# Patient Record
Sex: Female | Born: 1985 | Race: White | Hispanic: No | Marital: Married | Smoking: Former smoker
Health system: Southern US, Community
[De-identification: ages and names within clinical notes are randomized; demographics above are authoritative.]

## PROBLEM LIST (undated history)

## (undated) DIAGNOSIS — R519 Headache, unspecified: Secondary | ICD-10-CM

## (undated) DIAGNOSIS — R87629 Unspecified abnormal cytological findings in specimens from vagina: Secondary | ICD-10-CM

## (undated) DIAGNOSIS — R51 Headache: Secondary | ICD-10-CM

## (undated) DIAGNOSIS — N946 Dysmenorrhea, unspecified: Secondary | ICD-10-CM

## (undated) HISTORY — DX: Headache, unspecified: R51.9

## (undated) HISTORY — DX: Dysmenorrhea, unspecified: N94.6

## (undated) HISTORY — PX: COLPOSCOPY: SHX161

## (undated) HISTORY — DX: Unspecified abnormal cytological findings in specimens from vagina: R87.629

## (undated) HISTORY — DX: Headache: R51

---
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
## 2007-09-12 ENCOUNTER — Other Ambulatory Visit: Admission: RE | Admit: 2007-09-12 | Discharge: 2007-09-12 | Payer: Self-pay | Admitting: Obstetrics and Gynecology

## 2008-09-18 ENCOUNTER — Other Ambulatory Visit: Admission: RE | Admit: 2008-09-18 | Discharge: 2008-09-18 | Payer: Self-pay | Admitting: Obstetrics and Gynecology

## 2009-07-29 ENCOUNTER — Encounter: Admission: RE | Admit: 2009-07-29 | Discharge: 2009-07-29 | Payer: Self-pay | Admitting: Family Medicine

## 2009-07-29 ENCOUNTER — Telehealth: Payer: Self-pay | Admitting: Gastroenterology

## 2010-08-14 ENCOUNTER — Encounter: Payer: Self-pay | Admitting: Family Medicine

## 2010-08-24 NOTE — Progress Notes (Signed)
Summary: triage  Phone Note Call from Patient Call back at [phone]   Caller: Mom Clydie Braun Call For: any doc Reason for Call: Talk to Nurse Summary of Call: Mother would like her daughter seen asap do to vomitting, bloating, unable to eat states that symptoms are getting worse x 2 weeks Initial call taken by: Tawni Levy,  July 29, 2009 8:38 AM  Follow-up for Phone Call        Left message for Clydie Braun to call back.   Ashok Cordia RN  July 29, 2009 2:36 PM  Talked with pt's mother, Clydie Braun.  Pt saw PCP yesterday and had ultrasound and lab work done. Given Rx for nausea and prilosec. Has not heard from these tests yet.  Mother will wait and get results of these test and see how meds help pt.  She will report back if no improvement and sch appt here either with Dr. Jarold Motto or PA if no MD appts are available.   Follow-up by: Ashok Cordia RN,  July 30, 2009 12:32 PM

## 2011-09-04 ENCOUNTER — Ambulatory Visit: Payer: No Typology Code available for payment source

## 2012-04-12 ENCOUNTER — Ambulatory Visit: Payer: Self-pay | Admitting: Psychology

## 2012-04-18 ENCOUNTER — Ambulatory Visit (INDEPENDENT_AMBULATORY_CARE_PROVIDER_SITE_OTHER): Payer: 59 | Admitting: Psychology

## 2012-04-18 DIAGNOSIS — F4321 Adjustment disorder with depressed mood: Secondary | ICD-10-CM

## 2013-04-08 ENCOUNTER — Telehealth: Payer: Self-pay | Admitting: Nurse Practitioner

## 2013-04-08 MED ORDER — DESOGESTREL-ETHINYL ESTRADIOL 0.15-0.02/0.01 MG (21/5) PO TABS: 1.0000 | ORAL_TABLET | Freq: Every day | ORAL | Status: DC

## 2013-04-08 NOTE — Telephone Encounter (Signed)
Patient needs to have aex. (Not scheduled yet) wants to know if she can go ahead an get a refill on bc.  [facility] [address]

## 2013-04-08 NOTE — Telephone Encounter (Signed)
Patient is needing refill Aex scheduled for 05/09/13 #1/1 refill sent to last patient until AEX pt is aware.

## 2013-04-30 ENCOUNTER — Ambulatory Visit (INDEPENDENT_AMBULATORY_CARE_PROVIDER_SITE_OTHER): Payer: No Typology Code available for payment source | Admitting: Physician Assistant

## 2013-04-30 VITALS — BP 112/72 | HR 96 | Temp 98.0°F | Resp 20 | Ht 63.5 in | Wt 198.6 lb

## 2013-04-30 DIAGNOSIS — J029 Acute pharyngitis, unspecified: Secondary | ICD-10-CM

## 2013-04-30 DIAGNOSIS — J020 Streptococcal pharyngitis: Secondary | ICD-10-CM

## 2013-04-30 LAB — POCT RAPID STREP A (OFFICE): Rapid Strep A Screen: POSITIVE — AB

## 2013-04-30 MED ORDER — AMOXICILLIN 875 MG PO TABS: 875.0000 mg | ORAL_TABLET | Freq: Two times a day (BID) | ORAL | Status: DC

## 2013-04-30 NOTE — Progress Notes (Signed)
Patient ID: Ruth Bailey MRN: [id number], DOB: 05-22-1986, 27 y.o. Date of Encounter: 04/30/2013, 8:56 PM  Primary Physician: No primary provider on file.  Chief Complaint:  Chief Complaint  Patient presents with  . Sore Throat    started this morning  . Cough    with drainage    HPI: 27 y.o. female presents with a 1-2 day history of sore throat, fever, chills, and cough. Subjective fever and chills. Sore throat is worse along the left side. No congestion, rhinorrhea, sinus pressure, otalgia, or headache. Normal hearing. No GI complaints. Able to swallow saliva, but hurts to do so. Decreased appetite secondary to sore throat. Works as a Manufacturing systems engineer. Multiple sick contacts. Will be traveling to the [employer] the next day.   History reviewed. No pertinent past medical history.   Home Meds: Prior to Admission medications   Medication Sig Start Date End Date Taking? Authorizing Provider  desogestrel-ethinyl estradiol ([name]) 0.15-0.02/0.01 MG (21/5) tablet Take 1 tablet by mouth daily. 04/08/13  Yes Lauro Franklin, FNP           Allergies:  Allergies  Allergen Reactions  . Latex Hives    History   Social History  . Marital Status: Single    Spouse Name: N/A    Number of Children: N/A  . Years of Education: N/A   Occupational History  . Not on file.   Social History Main Topics  . Smoking status: Current Every Day Smoker -- 0.25 packs/day  . Smokeless tobacco: Not on file  . Alcohol Use: No  . Drug Use: No  . Sexual Activity: Yes   Other Topics Concern  . Not on file   Social History Narrative  . No narrative on file     Review of Systems: Constitutional: positive for fever, chills, and fatigue HEENT: see above Cardiovascular: negative for chest pain or palpitations Respiratory: negative for wheezing, or shortness of breath Abdominal: negative for abdominal pain, nausea, vomiting or diarrhea Dermatological: negative for rash Neurologic:  positive for headache   Physical Exam: Blood pressure 112/72, pulse 96, temperature 98 F (36.7 C), temperature source Oral, resp. rate 20, height 5' 3.5" (1.613 m), weight 198 lb 9.6 oz (90.084 kg), last menstrual period 04/18/2013, SpO2 99.00%., Body mass index is 34.62 kg/(m^2). General: Well developed, well nourished, in no acute distress. Head: Normocephalic, atraumatic, eyes without discharge, sclera non-icteric, nares are patent. Bilateral auditory canals clear, TM's are without perforation, pearly grey with reflective cone of light bilaterally. No sinus TTP. Oral cavity moist, dentition normal. Posterior pharynx erythematous with exudate. No post nasal drip or peritonsillar abscess. Uvula midline.  Neck: Supple. No thyromegaly. Full ROM. Lymph nodes: less than 2 cm AC bilaterally. Lungs: Clear bilaterally to auscultation without wheezes, rales, or rhonchi. Breathing is unlabored. Heart: RRR with S1 S2. No murmurs, rubs, or gallops appreciated. Msk:  Strength and tone normal for age. Extremities: No clubbing or cyanosis. No edema. Neuro: Alert and oriented X 3. Moves all extremities spontaneously. CNII-XII grossly in tact. Psych:  Responds to questions appropriately with a normal affect.   Labs: Results for orders placed in visit on 04/30/13  POCT RAPID STREP A (OFFICE)      Result Value Range   Rapid Strep A Screen Positive (*) Negative     ASSESSMENT AND PLAN:  27 y.o. female with strep pharyngitis -Amoxicillin 875 mg 1 po bid #20 no RF -New toothbrush -Tylenol/Motrin prn -Rest/fluids -RTC precautions -RTC 3-5 days if  no improvement  Signed, Eula Listen, PA-C [facility] [place], [place] [address] [phone] 04/30/2013 8:56 PM

## 2013-05-09 ENCOUNTER — Encounter: Payer: Self-pay | Admitting: Nurse Practitioner

## 2013-05-09 ENCOUNTER — Ambulatory Visit (INDEPENDENT_AMBULATORY_CARE_PROVIDER_SITE_OTHER): Payer: No Typology Code available for payment source | Admitting: Nurse Practitioner

## 2013-05-09 VITALS — BP 110/60 | HR 72 | Ht 69.5 in | Wt 192.0 lb

## 2013-05-09 DIAGNOSIS — Z0000 Encounter for general adult medical examination without abnormal findings: Secondary | ICD-10-CM

## 2013-05-09 MED ORDER — DESOGESTREL-ETHINYL ESTRADIOL 0.15-0.02/0.01 MG (21/5) PO TABS: 1.0000 | ORAL_TABLET | Freq: Every day | ORAL | Status: DC

## 2013-05-09 MED ORDER — FLUCONAZOLE 150 MG PO TABS: 150.0000 mg | ORAL_TABLET | Freq: Once | ORAL | Status: DC

## 2013-05-09 NOTE — Patient Instructions (Signed)
General topics  Next pap or exam is  due in 1 year Take a Women's multivitamin Take 1200 mg. of calcium daily - prefer dietary If any concerns in interim to call back  Breast Self-Awareness Practicing breast self-awareness may pick up problems early, prevent significant medical complications, and possibly save your life. By practicing breast self-awareness, you can become familiar with how your breasts look and feel and if your breasts are changing. This allows you to notice changes early. It can also offer you some reassurance that your breast health is good. One way to learn what is normal for your breasts and whether your breasts are changing is to do a breast self-exam. If you find a lump or something that was not present in the past, it is best to contact your caregiver right away. Other findings that should be evaluated by your caregiver include nipple discharge, especially if it is bloody; skin changes or reddening; areas where the skin seems to be pulled in (retracted); or new lumps and bumps. Breast pain is seldom associated with cancer (malignancy), but should also be evaluated by a caregiver. BREAST SELF-EXAM The best time to examine your breasts is 5 7 days after your menstrual period is over.  ExitCare Patient Information 2013 [employer].   Exercise to Stay Healthy Exercise helps you become and stay healthy. EXERCISE IDEAS AND TIPS Choose exercises that:  You enjoy.  Fit into your day. You do not need to exercise really hard to be healthy. You can do exercises at a slow or medium level and stay healthy. You can:  Stretch before and after working out.  Try yoga, Pilates, or tai chi.  Lift weights.  Walk fast, swim, jog, run, climb stairs, bicycle, dance, or rollerskate.  Take aerobic classes. Exercises that burn about 150 calories:  Running 1  miles in 15 minutes.  Playing volleyball for 45 to 60 minutes.  Washing and waxing a car for 45 to 60  minutes.  Playing touch football for 45 minutes.  Walking 1  miles in 35 minutes.  Pushing a stroller 1  miles in 30 minutes.  Playing basketball for 30 minutes.  Raking leaves for 30 minutes.  Bicycling 5 miles in 30 minutes.  Walking 2 miles in 30 minutes.  Dancing for 30 minutes.  Shoveling snow for 15 minutes.  Swimming laps for 20 minutes.  Walking up stairs for 15 minutes.  Bicycling 4 miles in 15 minutes.  Gardening for 30 to 45 minutes.  Jumping rope for 15 minutes.  Washing windows or floors for 45 to 60 minutes. Document Released: 08/12/2010 Document Revised: 10/02/2011 Document Reviewed: 08/12/2010 ExitCare Patient Information 2013 [employer].   Other topics ( that may be useful information):    Sexually Transmitted Disease Sexually transmitted disease (STD) refers to any infection that is passed from person to person during sexual activity. This may happen by way of saliva, semen, blood, vaginal mucus, or urine. Common STDs include:  Gonorrhea.  Chlamydia.  Syphilis.  HIV/AIDS.  Genital herpes.  Hepatitis B and C.  Trichomonas.  Human papillomavirus (HPV).  Pubic lice. CAUSES  An STD may be spread by bacteria, virus, or parasite. A person can get an STD by:  Sexual intercourse with an infected person.  Sharing sex toys with an infected person.  Sharing needles with an infected person.  Having intimate contact with the genitals, mouth, or rectal areas of an infected person. SYMPTOMS  Some people may not have any symptoms, but   they can still pass the infection to others. Different STDs have different symptoms. Symptoms include:  Painful or bloody urination.  Pain in the pelvis, abdomen, vagina, anus, throat, or eyes.  Skin rash, itching, irritation, growths, or sores (lesions). These usually occur in the genital or anal area.  Abnormal vaginal discharge.  Penile discharge in men.  Soft, flesh-colored skin growths in the  genital or anal area.  Fever.  Pain or bleeding during sexual intercourse.  Swollen glands in the groin area.  Yellow skin and eyes (jaundice). This is seen with hepatitis. DIAGNOSIS  To make a diagnosis, your caregiver may:  Take a medical history.  Perform a physical exam.  Take a specimen (culture) to be examined.  Examine a sample of discharge under a microscope.  Perform blood test TREATMENT   Chlamydia, gonorrhea, trichomonas, and syphilis can be cured with antibiotic medicine.  Genital herpes, hepatitis, and HIV can be treated, but not cured, with prescribed medicines. The medicines will lessen the symptoms.  Genital warts from HPV can be treated with medicine or by freezing, burning (electrocautery), or surgery. Warts may come back.  HPV is a virus and cannot be cured with medicine or surgery.However, abnormal areas may be followed very closely by your caregiver and may be removed from the cervix, vagina, or vulva through office procedures or surgery. If your diagnosis is confirmed, your recent sexual partners need treatment. This is true even if they are symptom-free or have a negative culture or evaluation. They should not have sex until their caregiver says it is okay. HOME CARE INSTRUCTIONS  All sexual partners should be informed, tested, and treated for all STDs.  Take your antibiotics as directed. Finish them even if you start to feel better.  Only take over-the-counter or prescription medicines for pain, discomfort, or fever as directed by your caregiver.  Rest.  Eat a balanced diet and drink enough fluids to keep your urine clear or pale yellow.  Do not have sex until treatment is completed and you have followed up with your caregiver. STDs should be checked after treatment.  Keep all follow-up appointments, Pap tests, and blood tests as directed by your caregiver.  Only use latex condoms and water-soluble lubricants during sexual activity. Do not use  petroleum jelly or oils.  Avoid alcohol and illegal drugs.  Get vaccinated for HPV and hepatitis. If you have not received these vaccines in the past, talk to your caregiver about whether one or both might be right for you.  Avoid risky sex practices that can break the skin. The only way to avoid getting an STD is to avoid all sexual activity.Latex condoms and dental dams (for oral sex) will help lessen the risk of getting an STD, but will not completely eliminate the risk. SEEK MEDICAL CARE IF:   You have a fever.  You have any new or worsening symptoms. Document Released: 09/30/2002 Document Revised: 10/02/2011 Document Reviewed: 10/07/2010 ExitCare Patient Information 2013 ExitCare[employer] LLC.    Domestic Abuse You are being battered or abused if someone close to you hits, pushes, or physically hurts you in any way. You also are being abused if you are forced into activities. You are being sexually abused if you are forced to have sexual contact of any kind. You are being emotionally abused if you are made to feel worthless or if you are constantly threatened. It is important to remember that help is available. No one has the right to abuse you. PREVENTION OF FURTHER   ABUSE  Learn the warning signs of danger. This varies with situations but may include: the use of alcohol, threats, isolation from friends and family, or forced sexual contact. Leave if you feel that violence is going to occur.  If you are attacked or beaten, report it to the police so the abuse is documented. You do not have to press charges. The police can protect you while you or the attackers are leaving. Get the officer's name and badge number and a copy of the report.  Find someone you can trust and tell them what is happening to you: your caregiver, a nurse, clergy member, close friend or family member. Feeling ashamed is natural, but remember that you have done nothing wrong. No one deserves abuse. Document Released:  07/07/2000 Document Revised: 10/02/2011 Document Reviewed: 09/15/2010 ExitCare Patient Information 2013 [employer].    How Much is Too Much Alcohol? Drinking too much alcohol can cause injury, accidents, and health problems. These types of problems can include:   Car crashes.  Falls.  Family fighting (domestic violence).  Drowning.  Fights.  Injuries.  Burns.  Damage to certain organs.  Having a baby with birth defects. ONE DRINK CAN BE TOO MUCH WHEN YOU ARE:  Working.  Pregnant or breastfeeding.  Taking medicines. Ask your doctor.  Driving or planning to drive. If you or someone you know has a drinking problem, get help from a doctor.  Document Released: 05/06/2009 Document Revised: 10/02/2011 Document Reviewed: 05/06/2009 ExitCare Patient Information 2013 [employer].   Smoking Hazards Smoking cigarettes is extremely bad for your health. Tobacco smoke has over 200 known poisons in it. There are over 60 chemicals in tobacco smoke that cause cancer. Some of the chemicals found in cigarette smoke include:   Cyanide.  Benzene.  Formaldehyde.  Methanol (wood alcohol).  Acetylene (fuel used in welding torches).  Ammonia. Cigarette smoke also contains the poisonous gases nitrogen oxide and carbon monoxide.  Cigarette smokers have an increased risk of many serious medical problems and Smoking causes approximately:  90% of all lung cancer deaths in men.  80% of all lung cancer deaths in women.  90% of deaths from chronic obstructive lung disease. Compared with nonsmokers, smoking increases the risk of:  Coronary heart disease by 2 to 4 times.  Stroke by 2 to 4 times.  Men developing lung cancer by 23 times.  Women developing lung cancer by 13 times.  Dying from chronic obstructive lung diseases by 12 times.  . Smoking is the most preventable cause of death and disease in our society.  WHY IS SMOKING ADDICTIVE?  Nicotine is the chemical  agent in tobacco that is capable of causing addiction or dependence.  When you smoke and inhale, nicotine is absorbed rapidly into the bloodstream through your lungs. Nicotine absorbed through the lungs is capable of creating a powerful addiction. Both inhaled and non-inhaled nicotine may be addictive.  Addiction studies of cigarettes and spit tobacco show that addiction to nicotine occurs mainly during the teen years, when young people begin using tobacco products. WHAT ARE THE BENEFITS OF QUITTING?  There are many health benefits to quitting smoking.   Likelihood of developing cancer and heart disease decreases. Health improvements are seen almost immediately.  Blood pressure, pulse rate, and breathing patterns start returning to normal soon after quitting. QUITTING SMOKING   American Lung Association - 1-800-LUNGUSA  American Cancer Society - 1-800-ACS-2345 Document Released: 08/17/2004 Document Revised: 10/02/2011 Document Reviewed: 04/21/2009 ExitCare Patient Information 2013 ExitCare,   LLC.   Stress Management Stress is a state of physical or mental tension that often results from changes in your life or normal routine. Some common causes of stress are:  Death of a loved one.  Injuries or severe illnesses.  Getting fired or changing jobs.  Moving into a new home. Other causes may be:  Sexual problems.  Business or financial losses.  Taking on a large debt.  Regular conflict with someone at home or at work.  Constant tiredness from lack of sleep. It is not just bad things that are stressful. It may be stressful to:  Win the lottery.  Get married.  Buy a new car. The amount of stress that can be easily tolerated varies from person to person. Changes generally cause stress, regardless of the types of change. Too much stress can affect your health. It may lead to physical or emotional problems. Too little stress (boredom) may also become stressful. SUGGESTIONS TO  REDUCE STRESS:  Talk things over with your family and friends. It often is helpful to share your concerns and worries. If you feel your problem is serious, you may want to get help from a professional counselor.  Consider your problems one at a time instead of lumping them all together. Trying to take care of everything at once may seem impossible. List all the things you need to do and then start with the most important one. Set a goal to accomplish 2 or 3 things each day. If you expect to do too many in a single day you will naturally fail, causing you to feel even more stressed.  Do not use alcohol or drugs to relieve stress. Although you may feel better for a short time, they do not remove the problems that caused the stress. They can also be habit forming.  Exercise regularly - at least 3 times per week. Physical exercise can help to relieve that "uptight" feeling and will relax you.  The shortest distance between despair and hope is often a good night's sleep.  Go to bed and get up on time allowing yourself time for appointments without being rushed.  Take a short "time-out" period from any stressful situation that occurs during the day. Close your eyes and take some deep breaths. Starting with the muscles in your face, tense them, hold it for a few seconds, then relax. Repeat this with the muscles in your neck, shoulders, hand, stomach, back and legs.  Take good care of yourself. Eat a balanced diet and get plenty of rest.  Schedule time for having fun. Take a break from your daily routine to relax. HOME CARE INSTRUCTIONS   Call if you feel overwhelmed by your problems and feel you can no longer manage them on your own.  Return immediately if you feel like hurting yourself or someone else. Document Released: 01/03/2001 Document Revised: 10/02/2011 Document Reviewed: 08/26/2007 ExitCare Patient Information 2013 ExitCare, [employer].   

## 2013-05-09 NOTE — Progress Notes (Signed)
Patient ID: Ruth Bailey, female   DOB: [birth year], 27 y.o.   MRN: [id number] 27 y.o. G0 Single Caucasian Fe here for annual exam. Menses is usually 4-6 days on [name]. Has been off [name] since end of September even though refill was called in, pt did not get notification.  Using condoms each time for birth control.  LMP 04/18/13.  Will restart [name] after next menses.  In late November will call us back to get [facility] IUD.  Insurance changes take effect end of that month.  Same partner X 2.5 years.  Now engaged and to be married 04/2014. Since on Augmentin now getting a yeast infection.  Patient's last menstrual period was 04/18/2013.          Sexually active: yes  The current method of family planning is [name] (estrogen/progesterone).    Exercising: yes  Gym/ health club routine includes [employer], kickboxing 3-4 times per week. Smoker:  Former, quit 6 months ago  Health Maintenance: Pap:  04/09/12, ASCUS, neg HR HPV TDaP:  03/09/11 Gardasil completed 7/07 Labs: declined    reports that she quit smoking about 6 months ago. She has never used smokeless tobacco. She reports that she drinks about 1.5 ounces of alcohol per week. She reports that she does not use illicit drugs.  History reviewed. No pertinent past medical history.  History reviewed. No pertinent past surgical history.  Current Outpatient Prescriptions  Medication Sig Dispense Refill  . amoxicillin (AMOXIL) 875 MG tablet Take 875 mg by mouth 2 (two) times daily.      Marland Kitchen desogestrel-ethinyl estradiol ([name]) 0.15-0.02/0.01 MG (21/5) tablet Take 1 tablet by mouth daily.  1 Package  1   No current facility-administered medications for this visit.    Family History  Problem Relation Age of Onset  . Cancer Maternal Grandfather     lung cancer  . Cancer Paternal Grandmother 43    bone cancer    ROS:  Pertinent items are noted in HPI.  Otherwise, a comprehensive ROS was negative.  Exam:   BP 110/60  Pulse 72  Ht 5' 9.5" (1.765 m)   Wt 192 lb (87.091 kg)  BMI 27.96 kg/m2  LMP 04/18/2013 Height: 5' 9.5" (176.5 cm)  Ht Readings from Last 3 Encounters:  05/09/13 5' 9.5" (1.765 m)  04/30/13 5' 3.5" (1.613 m)    General appearance: alert, cooperative and appears stated age Head: Normocephalic, without obvious abnormality, atraumatic Neck: no adenopathy, supple, symmetrical, trachea midline and thyroid normal to inspection and palpation Lungs: clear to auscultation bilaterally Breasts: normal appearance, no masses or tenderness Heart: regular rate and rhythm Abdomen: soft, non-tender; no masses,  no organomegaly Extremities: extremities normal, atraumatic, no cyanosis or edema Skin: Skin color, texture, turgor normal. No rashes or lesions Lymph nodes: Cervical, supraclavicular, and axillary nodes normal. No abnormal inguinal nodes palpated Neurologic: Grossly normal   Pelvic: External genitalia:  no lesions              Urethra:  normal appearing urethra with no masses, tenderness or lesions              Bartholin's and Skene's: normal                 Vagina: normal appearing vagina with normal color and Bracy thick discharge consistent with yeast discharge, no lesions              Cervix: anteverted  Pap taken: yes Bimanual Exam:  Uterus:  normal size, contour, position, consistency, mobility, non-tender              Adnexa: no mass, fullness, tenderness               Rectovaginal: Confirms               Anus:  normal sphincter tone, no lesions  A:  Well Woman with normal exam  Contraception  Yeast vaginitis from antibiotics  History of ASCUS pap with negative HR HPV lst year  P:   Pap smear as per guidelines Done today  Refill [name] 3 months / 3  Diflucan 150 mg X 2 doses  Counseled on breast self exam, adequate intake of calcium and vitamin D, diet and exercise return annually or prn  An After Visit Summary was printed and given to the patient.

## 2013-05-10 NOTE — Progress Notes (Signed)
Encounter reviewed by Dr. Elaine Roanhorse Silva.  

## 2013-05-13 LAB — IPS PAP TEST WITH REFLEX TO HPV

## 2013-07-19 ENCOUNTER — Ambulatory Visit: Payer: No Typology Code available for payment source

## 2013-07-21 ENCOUNTER — Ambulatory Visit (INDEPENDENT_AMBULATORY_CARE_PROVIDER_SITE_OTHER): Payer: No Typology Code available for payment source | Admitting: Nurse Practitioner

## 2013-07-21 ENCOUNTER — Encounter: Payer: Self-pay | Admitting: Nurse Practitioner

## 2013-07-21 VITALS — BP 122/80 | HR 64 | Ht 69.5 in | Wt 192.0 lb

## 2013-07-21 DIAGNOSIS — R109 Unspecified abdominal pain: Secondary | ICD-10-CM

## 2013-07-21 LAB — POCT URINALYSIS DIPSTICK
Bilirubin, UA: NEGATIVE
Glucose, UA: NEGATIVE
Protein, UA: NEGATIVE
Urobilinogen, UA: NEGATIVE
pH, UA: 6

## 2013-07-21 NOTE — Progress Notes (Signed)
Subjective:     Patient ID: Ruth Bailey, female   DOB: 04/11/1986, 27 y.o.   MRN: [id number]  HPI  This 27 yo SW  female complaints of RLQ pain for 6 days. States pain was a sudden onset with location in RLQ.  Pain is described as initially sharp and stabbing but intermittent.  Now has radiation to right lower back.  Currently pain is intermittent and more of a pressure with occasional stabbing with a pain scale of 8-9/10.  No bloating, some nausea, no fever/ or chills. No UA symptoms. LMP 12/16 with pain onset 12/22 or 12/23. Vaginal spotting that is pink brownish started 3 days ago. Still on [name] and using condoms for birth control.  No missed or skipped pills. Currently pain scale 5-6/10. No change in partners. They are engaged and planning their wedding.   Review of Systems  HENT: Negative.   Respiratory: Negative.   Cardiovascular: Negative.   Gastrointestinal: Negative.  Negative for nausea, vomiting, abdominal pain, diarrhea, constipation, blood in stool, abdominal distention, anal bleeding and rectal pain.  Endocrine: Negative.   Genitourinary: Positive for menstrual problem and pelvic pain. Negative for dysuria, urgency, frequency, hematuria, flank pain, decreased urine volume, vaginal bleeding, vaginal discharge, genital sores, vaginal pain and dyspareunia.       Spotting started 3 days ago and in on CD # 10 of pill pack.  Musculoskeletal: Positive for back pain. Negative for joint swelling, myalgias, neck pain and neck stiffness.       Right lower back pain that radiates from the abdomen  Skin: Negative.   Neurological: Negative.   Psychiatric/Behavioral: Negative.        Objective:   Physical Exam  Constitutional: She is oriented to person, place, and time. She appears well-developed and well-nourished. No distress.  Pulmonary/Chest: Effort normal.  Abdominal: Soft. She exhibits no distension and no mass. There is tenderness. There is no rebound and no guarding.  No flank  pain, most of pain to palpation is RLQ without rebounding.  Genitourinary:  Very light brown vaginal spotting. No cervicitis. No tenderness on the left with bimanual exam. A lot of pain on the right side with ? OV cyst palpable - but she is guarding.  No pain on rectal exam. No mass. Normal BS.  Neurological: She is alert and oriented to person, place, and time.  Psychiatric: She has a normal mood and affect. Her behavior is normal. Judgment and thought content normal.       Assessment:     R/O right OV mass / cyst RLQ pain On [name]    Plan:    Will get PUS at [facility] and follow with results In the interim she will continue with OTC NSAID'S and heating pad. If symptoms worsens to call back ASAP.

## 2013-07-22 ENCOUNTER — Telehealth: Payer: Self-pay | Admitting: Nurse Practitioner

## 2013-07-22 ENCOUNTER — Ambulatory Visit (HOSPITAL_COMMUNITY): Payer: Self-pay

## 2013-07-22 ENCOUNTER — Ambulatory Visit (HOSPITAL_COMMUNITY)
Admission: RE | Admit: 2013-07-22 | Discharge: 2013-07-22 | Disposition: A | Discharge status: Home / Self Care | Payer: No Typology Code available for payment source | Source: Ambulatory Visit | Attending: Nurse Practitioner | Admitting: Nurse Practitioner

## 2013-07-22 DIAGNOSIS — N854 Malposition of uterus: Secondary | ICD-10-CM | POA: Insufficient documentation

## 2013-07-22 DIAGNOSIS — N946 Dysmenorrhea, unspecified: Secondary | ICD-10-CM | POA: Insufficient documentation

## 2013-07-22 DIAGNOSIS — R109 Unspecified abdominal pain: Secondary | ICD-10-CM

## 2013-07-22 DIAGNOSIS — R1031 Right lower quadrant pain: Secondary | ICD-10-CM | POA: Insufficient documentation

## 2013-07-22 NOTE — Progress Notes (Signed)
Encounter reviewed by Dr. Brook Silva.  

## 2013-07-22 NOTE — Telephone Encounter (Signed)
Returning call.

## 2013-07-22 NOTE — Telephone Encounter (Signed)
Left message to call for results of PUS done at [facility].  Asked to [facility] and ask for [facility].

## 2013-07-22 NOTE — Telephone Encounter (Signed)
patient is informed of PUS results and no evidence of an OV cyst.  She is better and has less pain. Her history and symptoms certainly were suggestive of an OV cyst but maybe already resolved.  She is informed that if she has continued pain or worsens to CB.  May need other studies such as CT scan.

## 2013-09-10 ENCOUNTER — Ambulatory Visit (INDEPENDENT_AMBULATORY_CARE_PROVIDER_SITE_OTHER): Payer: No Typology Code available for payment source | Admitting: Physician Assistant

## 2013-09-10 VITALS — BP 112/64 | HR 111 | Temp 98.6°F | Resp 18 | Wt 186.0 lb

## 2013-09-10 DIAGNOSIS — J101 Influenza due to other identified influenza virus with other respiratory manifestations: Secondary | ICD-10-CM

## 2013-09-10 DIAGNOSIS — J111 Influenza due to unidentified influenza virus with other respiratory manifestations: Secondary | ICD-10-CM

## 2013-09-10 DIAGNOSIS — R6889 Other general symptoms and signs: Secondary | ICD-10-CM

## 2013-09-10 LAB — POCT INFLUENZA A/B
INFLUENZA B, POC: NEGATIVE
Influenza A, POC: POSITIVE

## 2013-09-10 MED ORDER — GUAIFENESIN ER 1200 MG PO TB12: 1.0000 | ORAL_TABLET | Freq: Two times a day (BID) | ORAL | Status: AC

## 2013-09-10 MED ORDER — OSELTAMIVIR PHOSPHATE 75 MG PO CAPS: 75.0000 mg | ORAL_CAPSULE | Freq: Two times a day (BID) | ORAL | Status: AC

## 2013-09-10 MED ORDER — HYDROCOD POLST-CHLORPHEN POLST 10-8 MG/5ML PO LQCR: 5.0000 mL | Freq: Two times a day (BID) | ORAL | Status: AC

## 2013-09-10 NOTE — Patient Instructions (Signed)
Please push fluids.  Tylenol and Motrin for fever and body aches.    

## 2013-09-10 NOTE — Progress Notes (Signed)
   Subjective:    Patient ID: Ruth Bailey, female    DOB: September 24, 1985, 28 y.o.   MRN: [id number]  HPI Pt presents to clinic with facial pain and myalgias and chills for about 6 hours. She went to bed and felt fine and then this woke her up this am.  She feels terrible. 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  She tried some sudafed this am and that seemed to help her congestion a little.  OTC meds - sudafed Sick contacts - teacher Flu vaccine - no  Review of Systems  Constitutional: Positive for chills. Negative for fever.  HENT: Positive for congestion, postnasal drip and rhinorrhea (clear). Negative for sore throat.   Respiratory: Positive for cough (dry).   Gastrointestinal: Negative for nausea, vomiting and diarrhea.  Musculoskeletal: Positive for myalgias.  Neurological: Positive for headaches.       Objective:   Physical Exam  Vitals reviewed. Constitutional: She appears well-developed and well-nourished.  HENT:  Head: Normocephalic and atraumatic.  Right Ear: Hearing, tympanic membrane, external ear and ear canal normal.  Left Ear: Hearing, tympanic membrane, external ear and ear canal normal.  Nose: Mucosal edema (red) present. Rhinorrhea: clear.  Mouth/Throat: Uvula is midline, oropharynx is clear and moist and mucous membranes are normal.  Eyes: Conjunctivae are normal.  Cardiovascular: Normal rate, regular rhythm and normal heart sounds.   No murmur heard. Pulmonary/Chest: Effort normal and breath sounds normal. She has no wheezes.  Lymphadenopathy:       Head (right side): No tonsillar, no preauricular and no occipital adenopathy present.       Head (left side): No tonsillar, no preauricular and no occipital adenopathy present.    She has no cervical adenopathy.       Right: No supraclavicular adenopathy present.       Left: No supraclavicular adenopathy present.  Skin: Skin is warm and dry.  Psychiatric: She has a normal mood and affect.  Her behavior is normal. Judgment and thought content normal.     Results for orders placed in visit on 09/10/13  POCT INFLUENZA A/B      Result Value Ref Range   Influenza A, POC Positive     Influenza B, POC Negative          Assessment & Plan:  Flu-like symptoms - Plan: POCT Influenza A/B  Influenza A - Plan: oseltamivir (TAMIFLU) 75 MG capsule, Guaifenesin (MUCINEX MAXIMUM STRENGTH) 1200 MG TB12, chlorpheniramine-HYDROcodone (TUSSIONEX PENNKINETIC ER) 10-8 MG/5ML LQCR  Symptomatic care d/w pt.  Benny LennertSarah Weber PA-C 09/10/2013 11:18 AM

## 2014-02-17 ENCOUNTER — Encounter: Payer: Self-pay | Admitting: Nurse Practitioner

## 2014-02-17 ENCOUNTER — Ambulatory Visit (INDEPENDENT_AMBULATORY_CARE_PROVIDER_SITE_OTHER): Payer: No Typology Code available for payment source | Admitting: Nurse Practitioner

## 2014-02-17 VITALS — BP 130/80 | HR 72 | Ht 69.5 in | Wt 188.0 lb

## 2014-02-17 DIAGNOSIS — Z975 Presence of (intrauterine) contraceptive device: Secondary | ICD-10-CM

## 2014-02-17 NOTE — Progress Notes (Signed)
Patient ID: Ruth Bailey, female   DOB: 17-Apr-1986, 28 y.o.   MRN: [id number]  S: This 28 yo G0 WS Fe here for a consult about [name].  In the past she has been on the [employer] Ring, Ortho Evra Patch and now [name] with [name].  Her  menses now regular and lasting 4-5 days. Moderate to light.  Some cramps that are better with OTC NSAID's.  No PMS or weight gain.  She is getting married on October 9 th and would like to get Skylab IUD for contraception.  He is 32 and they want to wait about 3 years before planning a pregnancy.   She does have a history of right lower quadrant pain and was evaluated with PUS 07/21/13 but did not have an ovarian cyst.  Plan: Discussed Skyla vs Mirena IUD - she feels more comfortable having a menses  Information is given about [name]  Order is placed   She is informed that she will need consult visit with Dr. Edward JollySilva prior to insertion and rationale - she is in agreement with plan.  Consult visit: 15 minutes face to face

## 2014-02-18 ENCOUNTER — Telehealth: Payer: Self-pay | Admitting: Nurse Practitioner

## 2014-02-18 NOTE — Telephone Encounter (Signed)
I left a message for patient to call and schedule IUD consult with Dr.Silva. (msg left on my desk from Shirlyn GoltzPatty Grubb to call patient and schedule IUD consult with Dr.Silva.)

## 2014-02-19 NOTE — Progress Notes (Signed)
Encounter reviewed by Dr. Brook Silva.  

## 2014-02-25 ENCOUNTER — Encounter: Payer: Self-pay | Admitting: Obstetrics and Gynecology

## 2014-02-25 ENCOUNTER — Ambulatory Visit (INDEPENDENT_AMBULATORY_CARE_PROVIDER_SITE_OTHER): Payer: No Typology Code available for payment source | Admitting: Obstetrics and Gynecology

## 2014-02-25 VITALS — BP 118/64 | HR 96 | Resp 14 | Ht 69.5 in | Wt 189.8 lb

## 2014-02-25 DIAGNOSIS — Z3009 Encounter for other general counseling and advice on contraception: Secondary | ICD-10-CM

## 2014-02-25 MED ORDER — MISOPROSTOL 200 MCG PO TABS: ORAL_TABLET | ORAL | Status: DC

## 2014-02-25 NOTE — Progress Notes (Signed)
Patient ID: Ruth BerkshireJessica A Pals, female   DOB: 11-21-1985, 28 y.o.   MRN: [id number] GYNECOLOGY  VISIT   HPI: 28 y.o.   Single  Caucasian  female   G0P0 with Patient's last menstrual period was 02/12/2014.   here for  Consultation regarding [name].  Interested in long term contraception.   Engaged to be married.  [place] 2 months.   Menses are very regular.  No significant pain.   Used OCPs, NuvaRing, Ortho Evra.   GYNECOLOGIC HISTORY: Patient's last menstrual period was 02/12/2014. Contraception:  [name]'s--Azerette  Menopausal hormone therapy: n/a        OB History   Grav Para Term Preterm Abortions TAB SAB Ect Mult Living   0                  There are no active problems to display for this patient.   Past Medical History  Diagnosis Date  . Dysmenorrhea     better on [name]    History reviewed. No pertinent past surgical history.  Current Outpatient Prescriptions  Medication Sig Dispense Refill  . desogestrel-ethinyl estradiol ([name]) 0.15-0.02/0.01 MG (21/5) tablet Take 1 tablet by mouth daily.  3 Package  3   No current facility-administered medications for this visit.     ALLERGIES: Latex  Family History  Problem Relation Age of Onset  . Cancer Maternal Grandfather     lung cancer  . Cancer Paternal Grandmother 272    bone cancer    History   Social History  . Marital Status: Single    Spouse Name: N/A    Number of Children: 0  . Years of Education: N/A   Occupational History  . Not on file.   Social History Main Topics  . Smoking status: Former Smoker -- 0.25 packs/day for 4 years    Types: Cigarettes    Quit date: 11/07/2012  . Smokeless tobacco: Never Used  . Alcohol Use: 1.5 oz/week    3 drink(s) per week  . Drug Use: No  . Sexual Activity: Yes    Partners: Male    Birth Control/ Protection: Pill     Comment: Azerette   Other Topics Concern  . Not on file   Social History Narrative  . No narrative on file     ROS:  Pertinent items are noted in HPI.  PHYSICAL EXAMINATION:    BP 118/64  Pulse 96  Resp 14  Ht 5' 9.5" (1.765 m)  Wt 189 lb 12.8 oz (86.093 kg)  BMI 27.64 kg/m2  LMP 02/12/2014     General appearance: alert, cooperative and appears stated age.  Pelvic: External genitalia:  no lesions              Urethra:  normal appearing urethra with no masses, tenderness or lesions              Bartholins and Skenes: normal                 Vagina: normal appearing vagina with normal color and discharge, no lesions              Cervix: normal appearance                   Bimanual Exam:  Uterus:  uterus is normal size, shape, consistency and nontender  Adnexa: normal adnexa in size, nontender and no masses  ASSESSMENT  History of dysmenorrhea.  Desire for long term contraception.  PLAN  Counseled on [name] risks and benefits. Return during first 5 days of cycle (patient is on OCPs and will come in during the first 5 days of her last week of pills in the pack. )   An After Visit Summary was printed and given to the patient.  _15_____ minutes face to face time of which over 50% was spent in counseling.

## 2014-03-03 ENCOUNTER — Telehealth: Payer: Self-pay | Admitting: Nurse Practitioner

## 2014-03-03 NOTE — Telephone Encounter (Signed)
Spoke with patient. Advised that per benefits quote received, IUD and insertion is covered at 100%. There will be 0 patient liability. Patient is to call within the first 5 days of her cycle to schedule insertion. °

## 2014-03-09 ENCOUNTER — Telehealth: Payer: Self-pay | Admitting: Obstetrics and Gynecology

## 2014-03-09 NOTE — Telephone Encounter (Signed)
Spoke with patient. Patient states that she started her cycle today and would like to schedule IUD insertion at this time. Patient requesting late afternoon appointment at 3:45pm. Advised patient that latest appointment I have this week with Dr.Silva is 2pm on 8/20. 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 Advised patient that our only other option is to schedule with next cycle. Patient states that she really wanted the IUD in for one month before getting married. Patient will see if she can rearrange schedule and give us a call back to schedule if she can. "I may just not be able to get the IUD which is unfortunate."   Routing to provider for final review. Patient agreeable to disposition. Will close encounter

## 2014-03-09 NOTE — Telephone Encounter (Signed)
Patient calling to let us know she started her cycle wants to get IUD insertion scheduled

## 2014-03-10 ENCOUNTER — Telehealth: Payer: Self-pay | Admitting: Obstetrics and Gynecology

## 2014-03-10 NOTE — Telephone Encounter (Signed)
Patient wants to schedule IUD insertion appt that was offered is not available anymore.  Jannet AskewKaitlyn E Hines, RN at 03/09/2014 4:27 PM    Status: Signed       Spoke with patient. Patient states that she started her cycle today and would like to schedule IUD insertion at this time. Patient requesting late afternoon appointment at 3:45pm. Advised patient that latest appointment I have this week with Dr.Silva is 2pm on 8/20. 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 Advised patient that our only other option is to schedule with next cycle. Patient states that she really wanted the IUD in for one month before getting married. Patient will see if she can rearrange schedule and give us a call back to schedule if she can. "I may just not be able to get the IUD which is unfortunate."  Routing to provider for final review. Patient agreeable to disposition. Will close encounter        Tiffany A Decker at 03/09/2014 4:20 PM     Status: Signed        Patient calling to let us know she started her cycle wants to get IUD insertion scheduled

## 2014-03-10 NOTE — Telephone Encounter (Signed)
Spoke with patient. Advised spoke with Nurse manager Kennon RoundsSally. Appointment scheduled for Thursday 8/20 at 4pm with Dr.Silva (time per Kennon RoundsSally). Advised this is a work in spot and an exception. Advised patient to arrive 15 minutes early for appointment. Cytotec instructions given. Take one tablet the night before procedure and one tablet the morning of procedure. Pre procedure instructions given.  Motrin instructions given. Motrin=Advil=Ibuprofen, 800 mg one hour before appointment. Eat a meal and hydrate well before appointment. Patient agreeable and verbalizes understanding.  Routing to provider for final review. Patient agreeable to disposition. Will close encounter

## 2014-03-12 ENCOUNTER — Encounter: Payer: Self-pay | Admitting: Obstetrics and Gynecology

## 2014-03-12 ENCOUNTER — Ambulatory Visit (INDEPENDENT_AMBULATORY_CARE_PROVIDER_SITE_OTHER): Payer: No Typology Code available for payment source | Admitting: Obstetrics and Gynecology

## 2014-03-12 VITALS — BP 122/66 | HR 84 | Ht 69.5 in | Wt 192.0 lb

## 2014-03-12 DIAGNOSIS — Z3009 Encounter for other general counseling and advice on contraception: Secondary | ICD-10-CM

## 2014-03-12 DIAGNOSIS — Z30430 Encounter for insertion of intrauterine contraceptive device: Secondary | ICD-10-CM

## 2014-03-12 NOTE — Progress Notes (Signed)
Patient ID: Ruth Bailey, female   DOB: [birth year], 28 y.o.   MRN: [id number] GYNECOLOGY  VISIT   HPI: 28 y.o.   Single  Caucasian  female   G0P0 with Patient's last menstrual period was 03/09/2014.  Normal menses.  here for  [facility] IUD insertion.  Took Cytotec and Motrin.   GYNECOLOGIC HISTORY: Patient's last menstrual period was 03/09/2014. Contraception: [name]'s   Menopausal hormone therapy: n/a        OB History   Grav Para Term Preterm Abortions TAB SAB Ect Mult Living   0                  There are no active problems to display for this patient.   Past Medical History  Diagnosis Date  . Dysmenorrhea     better on [name]    No past surgical history on file.  Current Outpatient Prescriptions  Medication Sig Dispense Refill  . desogestrel-ethinyl estradiol ([name]) 0.15-0.02/0.01 MG (21/5) tablet Take 1 tablet by mouth daily.  3 Package  3  . misoprostol (CYTOTEC) 200 MCG tablet Take one tablet (200 mcg) the evening prior to your procedure and then one tablet the morning of your procedure  2 tablet  0   No current facility-administered medications for this visit.     ALLERGIES: Latex  Family History  Problem Relation Age of Onset  . Cancer Maternal Grandfather     lung cancer  . Cancer Paternal Grandmother 23    bone cancer    History   Social History  . Marital Status: Single    Spouse Name: N/A    Number of Children: 0  . Years of Education: N/A   Occupational History  . Not on file.   Social History Main Topics  . Smoking status: Former Smoker -- 0.25 packs/day for 4 years    Types: Cigarettes    Quit date: 11/07/2012  . Smokeless tobacco: Never Used  . Alcohol Use: 1.5 oz/week    3 drink(s) per week  . Drug Use: No  . Sexual Activity: Yes    Partners: Male    Birth Control/ Protection: Pill     Comment: Azerette   Other Topics Concern  . Not on file   Social History Narrative  . No narrative on file    ROS:  Pertinent items are noted  in HPI.  PHYSICAL EXAMINATION:    BP 122/66  Pulse 84  Ht 5' 9.5" (1.765 m)  Wt 192 lb (87.091 kg)  BMI 27.96 kg/m2  LMP 03/09/2014     General appearance: alert, cooperative and appears stated age   Pelvic: External genitalia:  no lesions              Urethra:  normal appearing urethra with no masses, tenderness or lesions              Bartholins and Skenes: normal                 Vagina: normal appearing vagina with normal color and discharge, no lesions              Cervix: normal appearance                   Bimanual Exam:  Uterus:  uterus is normal size, shape, consistency and nontender  Adnexa: normal adnexa in size, nontender and no masses                                        [name] insertion - lot number [id number], Expiration 08/2015  Consent for procedure.  Speculum placed in vagina.  Sterile prep of cervix with Hibiclens.  Tenaculum to anterior cervical lip Paracervical block with 10 cc 1% lidocaine - lot number [id number], Expiration 12/23/14. Uterus sounded to 7 cm.  [name] placed without difficulty.  Strings trimmed. No complications.  Minimal EBL.  Given Coke after procedure.   ASSESSMENT  Slyla IUD insertion.   PLAN  Precautions given.  Follow up in 5 weeks for a recheck.    An After Visit Summary was printed and given to the patient.

## 2014-03-12 NOTE — Patient Instructions (Signed)

## 2014-04-16 ENCOUNTER — Ambulatory Visit (INDEPENDENT_AMBULATORY_CARE_PROVIDER_SITE_OTHER): Payer: No Typology Code available for payment source | Admitting: Obstetrics and Gynecology

## 2014-04-16 ENCOUNTER — Encounter: Payer: Self-pay | Admitting: Obstetrics and Gynecology

## 2014-04-16 VITALS — BP 116/64 | HR 70 | Ht 69.5 in | Wt 200.0 lb

## 2014-04-16 DIAGNOSIS — Z30431 Encounter for routine checking of intrauterine contraceptive device: Secondary | ICD-10-CM

## 2014-04-16 NOTE — Patient Instructions (Signed)
Congratulations on your marriage!

## 2014-04-16 NOTE — Progress Notes (Signed)
Patient ID: Ruth Bailey, female   DOB: 11/18/1985, 28 y.o.   MRN: [id number] GYNECOLOGY  VISIT   HPI: 28 y.o.   Single  Caucasian  female   G0P0 with No LMP recorded. Patient is not currently having periods (Reason: IUD).   Having spotting.  Cramping the day of the insertion.  Sexually active since the insertion.  No problems.   Getting married in one week.   GYNECOLOGIC HISTORY: No LMP recorded. Patient is not currently having periods (Reason: IUD). Contraception:  [name]--inserted 03-12-14    Menopausal hormone therapy: n/a        OB History   Grav Para Term Preterm Abortions TAB SAB Ect Mult Living   0                  There are no active problems to display for this patient.   Past Medical History  Diagnosis Date  . Dysmenorrhea     better on [name]    History reviewed. No pertinent past surgical history.  Current Outpatient Prescriptions  Medication Sig Dispense Refill  . Levonorgestrel (SKYLA) 13.5 MG IUD by Intrauterine route.       No current facility-administered medications for this visit.     ALLERGIES: Latex  Family History  Problem Relation Age of Onset  . Cancer Maternal Grandfather     lung cancer  . Cancer Paternal Grandmother 41    bone cancer    History   Social History  . Marital Status: Single    Spouse Name: N/A    Number of Children: 0  . Years of Education: N/A   Occupational History  . Not on file.   Social History Main Topics  . Smoking status: Former Smoker -- 0.25 packs/day for 4 years    Types: Cigarettes    Quit date: 11/07/2012  . Smokeless tobacco: Never Used  . Alcohol Use: 1.5 oz/week    3 drink(s) per week  . Drug Use: No  . Sexual Activity: Yes    Partners: Male    Birth Control/ Protection: IUD     Comment: [name] inserted 03-12-14   Other Topics Concern  . Not on file   Social History Narrative  . No narrative on file    ROS:  Pertinent items are noted in HPI.  PHYSICAL EXAMINATION:    BP  116/64  Pulse 70  Ht 5' 9.5" (1.765 m)  Wt 200 lb (90.719 kg)  BMI 29.12 kg/m2     General appearance: alert, cooperative and appears stated age Lungs: clear to auscultation bilaterally Heart: regular rate and rhythm Abdomen: soft, non-tender; no masses,  no organomegaly No abnormal inguinal nodes palpated  Pelvic: External genitalia:  no lesions              Urethra:  normal appearing urethra with no masses, tenderness or lesions              Bartholins and Skenes: normal                 Vagina: normal appearing vagina with normal color and discharge, no lesions              Cervix: normal appearance.  IUD strings noted.                    Bimanual Exam:  Uterus:  uterus is normal size, shape, consistency and nontender  Adnexa: normal adnexa in size, nontender and no masses                                       ASSESSMENT  [name] patient.  Doing well.  PLAN  Return for annual exam and prn.     An After Visit Summary was printed and given to the patient.  ___15___ minutes face to face time of which over 50% was spent in counseling.

## 2015-01-12 IMAGING — US US TRANSVAGINAL NON-OB
1 series · 14 of 25 positions shown · non-contrast
Comparison: None

CLINICAL DATA: Right lower quadrant pain.  Dysmenorrhea.

EXAM:
TRANSABDOMINAL AND TRANSVAGINAL ULTRASOUND OF PELVIS
TECHNIQUE: Both transabdominal and transvaginal ultrasound examinations of the
pelvis were performed. Transabdominal technique was performed for
global imaging of the pelvis including uterus, ovaries, adnexal
regions, and pelvic cul-de-sac. It was necessary to proceed with
endovaginal exam following the transabdominal exam to visualize the
ovaries and adnexae.

[Series 1: us pelvis complete · 14 of 69 slices shown]
[im 1/69]
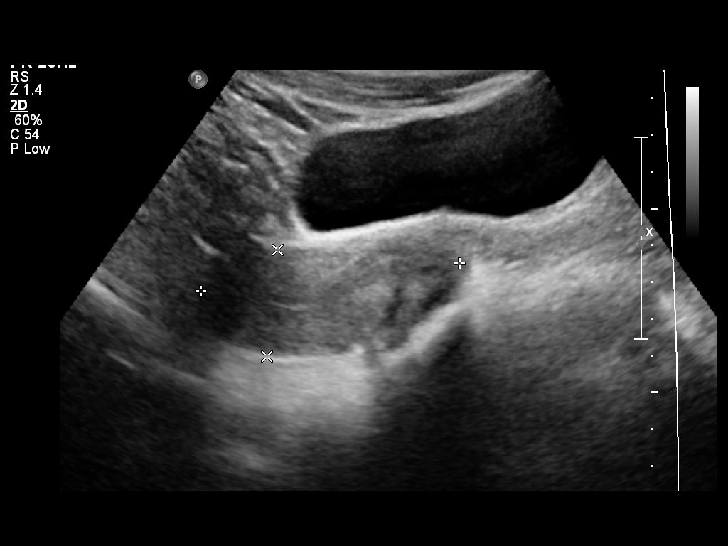
[im 6/69]
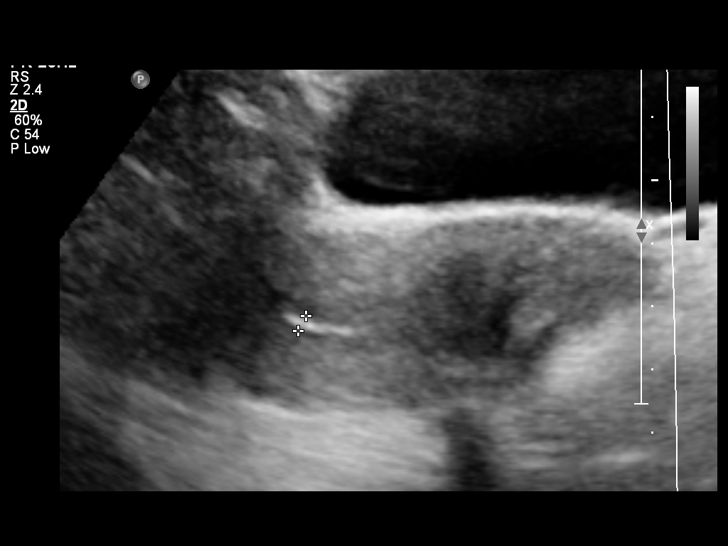
[im 12/69]
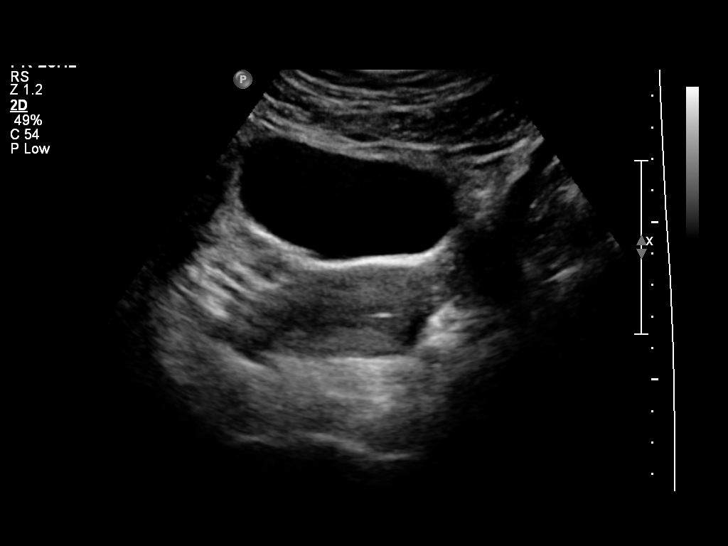
[im 18/69]
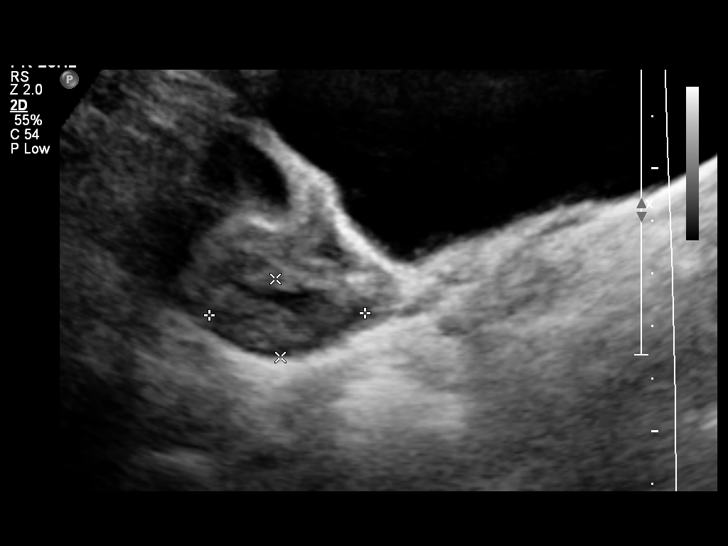
[im 23/69]
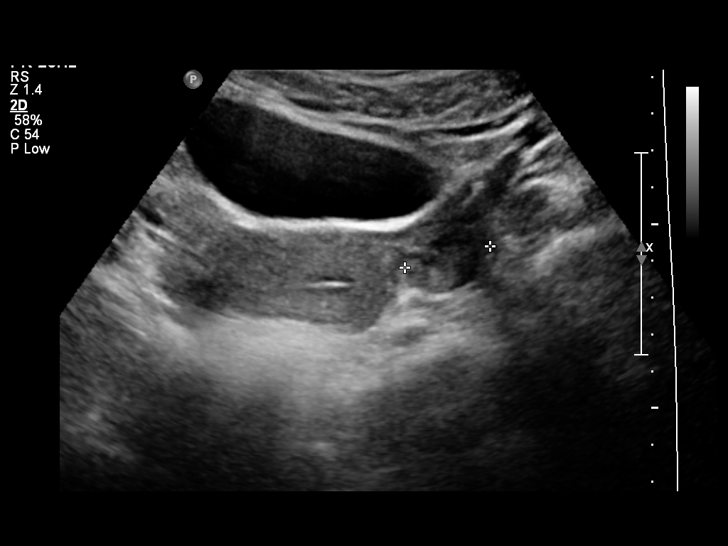
[im 26/69]
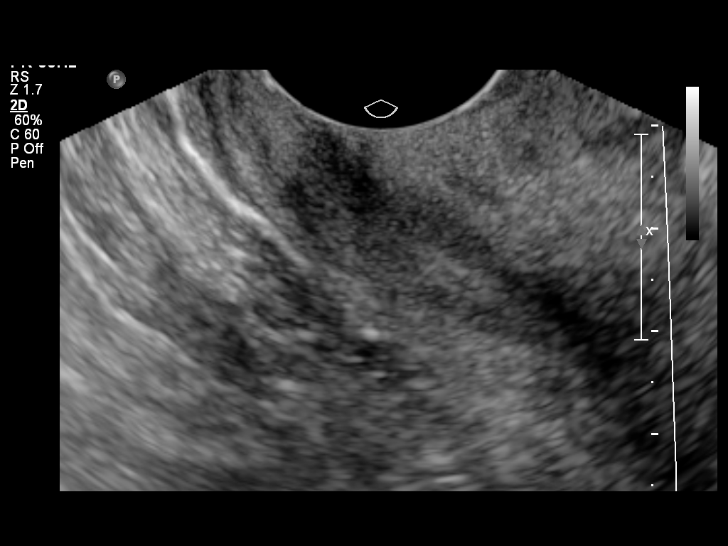
[im 32/69]
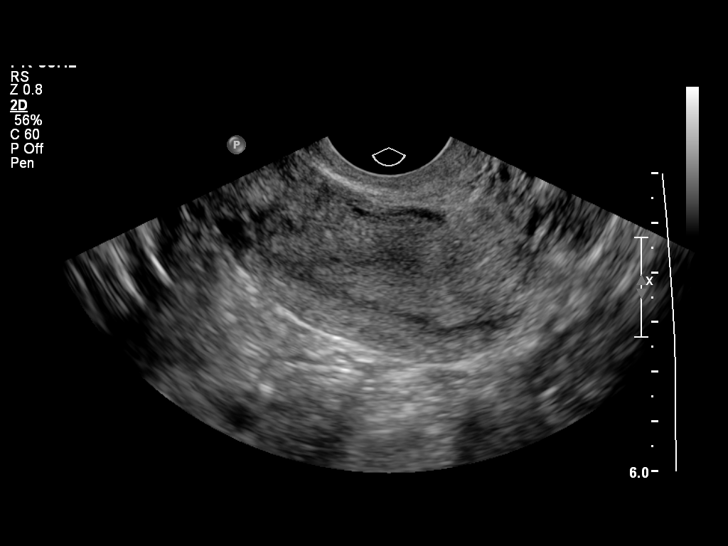
[im 37/69]
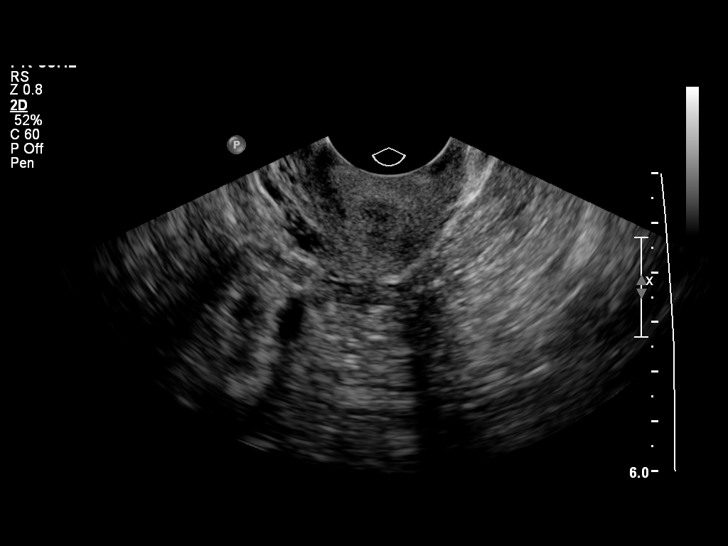
[im 43/69]
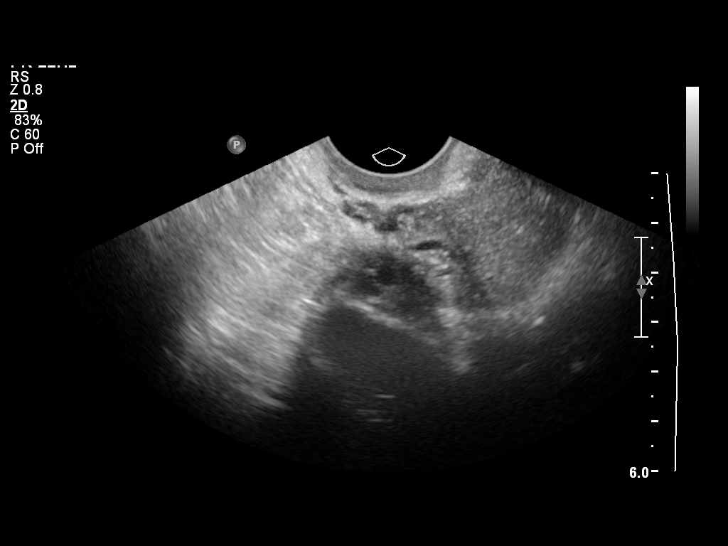
[im 46/69]
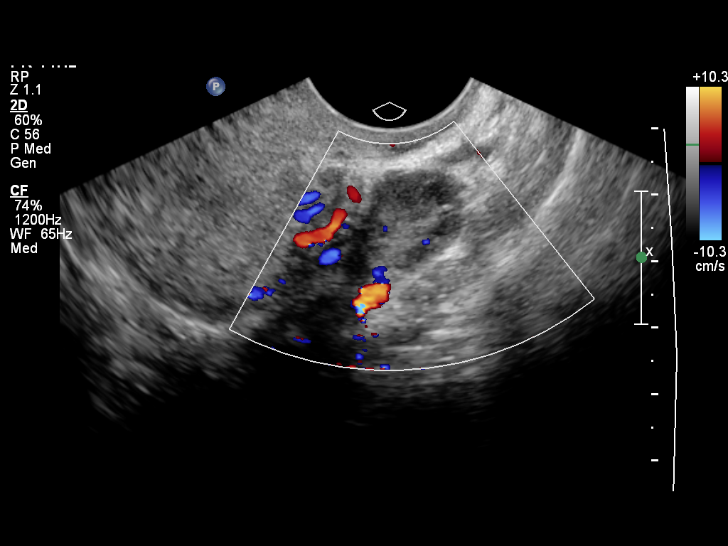
[im 52/69]
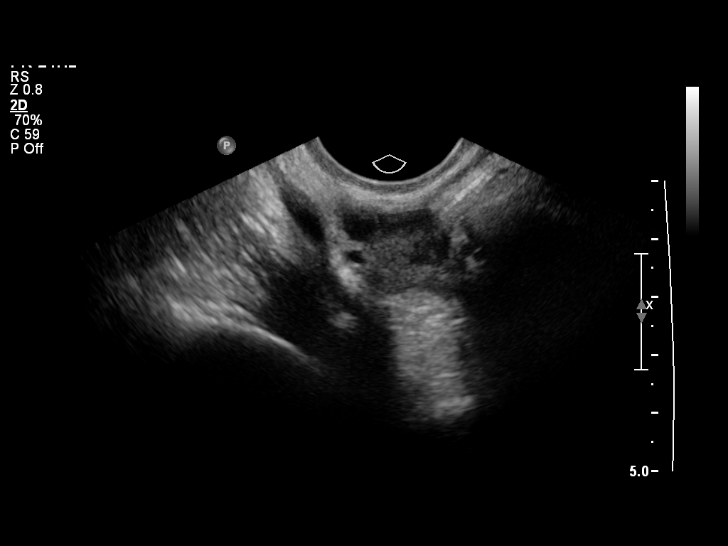
[im 57/69]
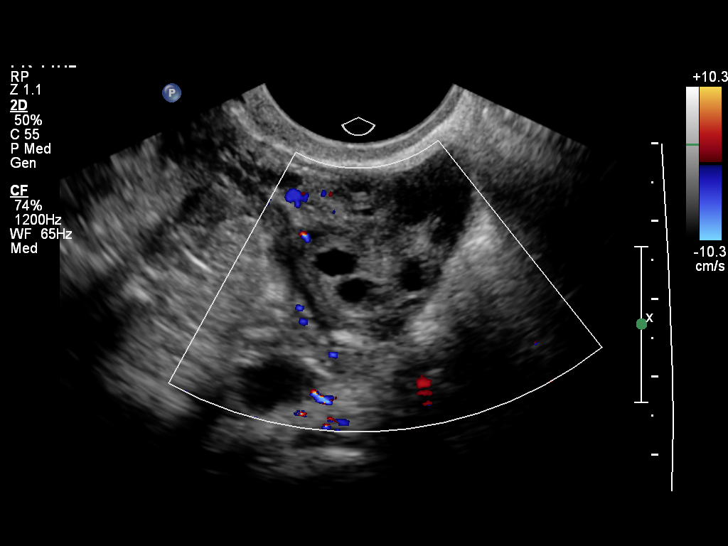
[im 63/69]
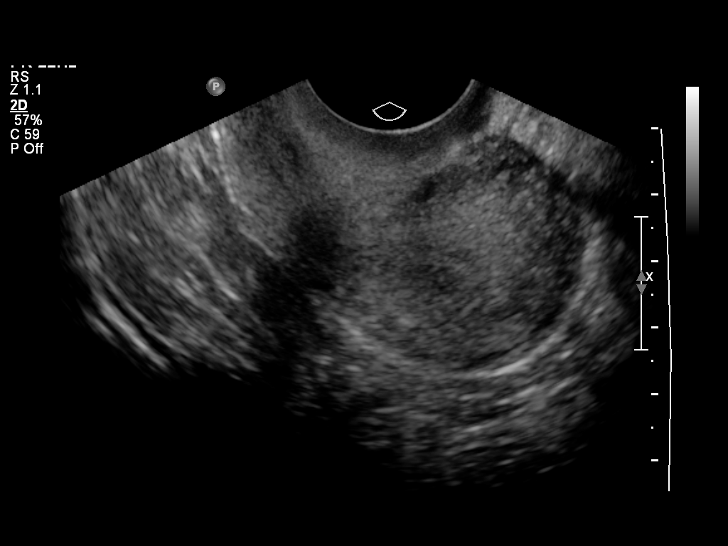
[im 69/69]
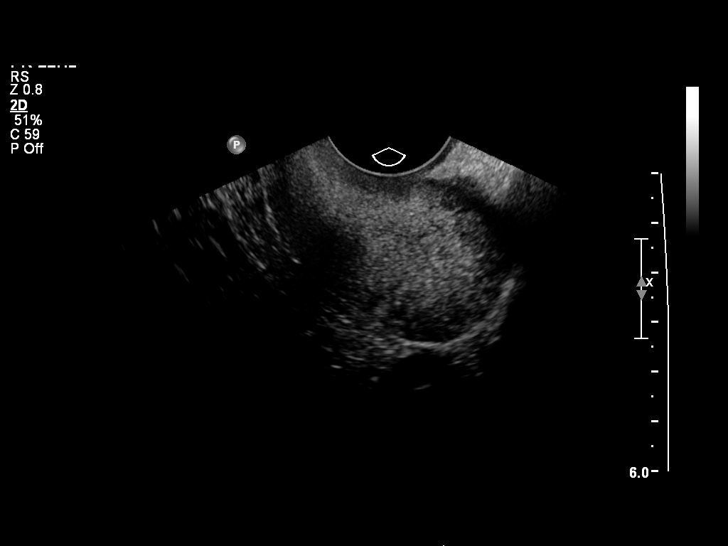

[14 of 25 positions shown; findings below may reference images not displayed]

FINDINGS: Uterus

Measurements: 7.1 x 2.9 x 4.7 cm. Retroflexed. No fibroids or other
mass visualized.

Endometrium

Thickness: 4 mm.  No focal abnormality visualized.

Right ovary

Measurements: 3.1 x 2.1 x 3.2 cm. Normal appearance/no adnexal mass.

Left ovary

Measurements: 2.5 x 1.7 x 2.9 cm. Normal appearance/no adnexal mass.

Other findings

No free fluid.
IMPRESSION: Normal appearance of uterus and both ovaries. No pelvic mass or
other significant abnormality identified.

## 2016-04-04 ENCOUNTER — Telehealth: Payer: Self-pay | Admitting: Obstetrics and Gynecology

## 2016-04-04 NOTE — Telephone Encounter (Signed)
Returned call to patient. Patient requesting to have IUD removed on 05/05/16. Procedure visit scheduled for Friday 05/05/16 at 1500. Patient agreeable to date and time of appointment and aware to arrive 15 minutes early.   Routing to provider for final review. Patient agreeable to disposition. Will close encounter.

## 2016-04-04 NOTE — Telephone Encounter (Signed)
Patient is ready to have iud removed.

## 2016-05-05 ENCOUNTER — Telehealth: Payer: Self-pay | Admitting: Obstetrics and Gynecology

## 2016-05-05 ENCOUNTER — Ambulatory Visit: Payer: Self-pay | Admitting: Obstetrics and Gynecology

## 2016-05-05 ENCOUNTER — Encounter: Payer: Self-pay | Admitting: Obstetrics and Gynecology

## 2016-05-05 ENCOUNTER — Other Ambulatory Visit: Payer: Self-pay | Admitting: Obstetrics and Gynecology

## 2016-05-05 ENCOUNTER — Ambulatory Visit (INDEPENDENT_AMBULATORY_CARE_PROVIDER_SITE_OTHER): Payer: 59 | Admitting: Obstetrics and Gynecology

## 2016-05-05 VITALS — BP 112/64 | HR 64 | Ht 69.0 in | Wt 192.0 lb

## 2016-05-05 DIAGNOSIS — Z01419 Encounter for gynecological examination (general) (routine) without abnormal findings: Secondary | ICD-10-CM | POA: Diagnosis not present

## 2016-05-05 DIAGNOSIS — Z30432 Encounter for removal of intrauterine contraceptive device: Secondary | ICD-10-CM

## 2016-05-05 DIAGNOSIS — Z0000 Encounter for general adult medical examination without abnormal findings: Secondary | ICD-10-CM | POA: Diagnosis not present

## 2016-05-05 LAB — COMPREHENSIVE METABOLIC PANEL
ALBUMIN: 4.5 g/dL (ref 3.6–5.1)
ALT: 13 U/L (ref 6–29)
AST: 16 U/L (ref 10–30)
Alkaline Phosphatase: 78 U/L (ref 33–115)
BILIRUBIN TOTAL: 0.4 mg/dL (ref 0.2–1.2)
BUN: 14 mg/dL (ref 7–25)
CALCIUM: 9.6 mg/dL (ref 8.6–10.2)
CHLORIDE: 101 mmol/L (ref 98–110)
CO2: 26 mmol/L (ref 20–31)
Creat: 0.7 mg/dL (ref 0.50–1.10)
Glucose, Bld: 91 mg/dL (ref 65–99)
Potassium: 4 mmol/L (ref 3.5–5.3)
Sodium: 137 mmol/L (ref 135–146)
Total Protein: 7.1 g/dL (ref 6.1–8.1)

## 2016-05-05 LAB — POCT URINALYSIS DIPSTICK
Bilirubin, UA: NEGATIVE
Blood, UA: NEGATIVE
Glucose, UA: NEGATIVE
Ketones, UA: NEGATIVE
LEUKOCYTES UA: NEGATIVE
Nitrite, UA: NEGATIVE
PROTEIN UA: NEGATIVE
UROBILINOGEN UA: NEGATIVE
pH, UA: 5

## 2016-05-05 LAB — CBC
HEMATOCRIT: 40.1 % (ref 35.0–45.0)
HEMOGLOBIN: 13.7 g/dL (ref 11.7–15.5)
MCH: 30.9 pg (ref 27.0–33.0)
MCHC: 34.2 g/dL (ref 32.0–36.0)
MCV: 90.3 fL (ref 80.0–100.0)
MPV: 10.3 fL (ref 7.5–12.5)
Platelets: 366 10*3/uL (ref 140–400)
RBC: 4.44 MIL/uL (ref 3.80–5.10)
RDW: 13.4 % (ref 11.0–15.0)
WBC: 9.2 10*3/uL (ref 3.8–10.8)

## 2016-05-05 LAB — LIPID PANEL
Cholesterol: 145 mg/dL (ref 125–200)
HDL: 51 mg/dL (ref >=46)
LDL Cholesterol: 57 mg/dL (ref <130)
TRIGLYCERIDES: 187 mg/dL — AB (ref <150)
Total CHOL/HDL Ratio: 2.8 Ratio (ref <=5.0)
VLDL: 37 mg/dL — ABNORMAL HIGH (ref <30)

## 2016-05-05 LAB — HEMOGLOBIN, FINGERSTICK: HEMOGLOBIN, FINGERSTICK: 13.1 g/dL (ref 12.0–16.0)

## 2016-05-05 NOTE — Patient Instructions (Addendum)

## 2016-05-05 NOTE — Telephone Encounter (Signed)
Called patient to review benefits for a recommended procedure. Left Voicemail requesting a call back. °

## 2016-05-05 NOTE — Progress Notes (Signed)
Patient ID: Ruth Bailey, female   DOB: [birth year], 30 y.o.   MRN: [id number]  30 y.o. G0P0000 Married Caucasian female here for annual exam.   Desires IUD removal to start a family.    Having menses monthly since IUD placement.  Last menses lasted 8 days. Cramping every other month.  Going on a late honeymoon to [place].   No FH of genetic medical illnesses.  Is Jewish, not Askenazi.  PCP: No PCP  Patient's last menstrual period was 04/21/2016 (exact date).           Sexually active: Yes.    The current method of family planning is IUD. Skyla inserted 03/12/14. Exercising: Yes.    Kick boxing Smoker:  Former smoker  Health Maintenance: Pap:  05/09/13, Negative  History of abnormal Pap:  no MMG:  N/A Colonoscopy:  N/A BMD:   N/A  Result  N/A TDaP:  03/09/11 Gardasil: completed 01/2006 HIV: past blood donation Hep C:  NA Screening Labs:  Hb today: 13.1, Urine today: Negative    reports that she quit smoking about 3 years ago. Her smoking use included Cigarettes. She has a 1.00 pack-year smoking history. She has never used smokeless tobacco. She reports that she drinks about 1.5 oz of alcohol per week . She reports that she does not use drugs.  Past Medical History:  Diagnosis Date  . Dysmenorrhea    better on [name]    No past surgical history on file.  No current outpatient prescriptions on file.   No current facility-administered medications for this visit.     Family History  Problem Relation Age of Onset  . Cancer Maternal Grandfather     lung cancer  . Cancer Paternal Grandmother 59    bone cancer    ROS:  Pertinent items are noted in HPI.  Otherwise, a comprehensive ROS was negative.  Exam:   BP 112/64 (BP Location: Right Arm, Patient Position: Sitting, Cuff Size: Large)   Pulse 64   Ht 5\' 9"  (1.753 m)   Wt 192 lb (87.1 kg)   LMP 04/21/2016 (Exact Date)   BMI 28.35 kg/m     General appearance: alert, cooperative and appears stated age Head:  Normocephalic, without obvious abnormality, atraumatic Neck: no adenopathy, supple, symmetrical, trachea midline and thyroid normal to inspection and palpation Lungs: clear to auscultation bilaterally Breasts: normal appearance, no masses or tenderness, No nipple retraction or dimpling, No nipple discharge or bleeding, No axillary or supraclavicular adenopathy Heart: regular rate and rhythm Abdomen: soft, non-tender; no masses, no organomegaly Extremities: extremities normal, atraumatic, no cyanosis or edema Skin: Skin color, texture, turgor normal. No rashes or lesions Lymph nodes: Cervical, supraclavicular, and axillary nodes normal. No abnormal inguinal nodes palpated Neurologic: Grossly normal  Pelvic: External genitalia:  no lesions              Urethra:  normal appearing urethra with no masses, tenderness or lesions              Bartholins and Skenes: normal                 Vagina: normal appearing vagina with normal color and discharge, no lesions              Cervix: no lesions.  IUD strings noted.  IUD removed intact, shown to pt, and discarded.              Pap taken: Yes.   Bimanual Exam:  Uterus:  normal size, contour, position, consistency, mobility, non-tender              Adnexa: no mass, fullness, tenderness             Chaperone was present for exam.  Assessment:   Well woman visit with normal exam. Desire for pregnancy.  [name] removal.  Plan:   Recommended self breast exam.  Pap done. Start PNV.  Take for 4 - 6 weeks prior to trying for pregnancy.  Use condoms now for 4 - 6 weeks. Prenatal counseling.  Patient and husband to get flu vaccines. Routine labs. I recommended reading, "What to Expect When Trying for Pregnancy." Follow up annually and prn.      After visit summary provided.

## 2016-05-08 ENCOUNTER — Telehealth: Payer: Self-pay

## 2016-05-08 DIAGNOSIS — R87612 Low grade squamous intraepithelial lesion on cytologic smear of cervix (LGSIL): Secondary | ICD-10-CM

## 2016-05-08 LAB — IPS PAP TEST WITH REFLEX TO HPV

## 2016-05-08 NOTE — Telephone Encounter (Signed)
-----   Message from Ruth SallesBrook E Amundson C Silva, MD sent at 05/08/2016  8:08 AM EDT ----- Please inform the patient of her lab results.  Her cholesterol shows elevated triglycerides.   I do not recall if the labs were fasting or not.  She can lower triglycerides by following a diet low in saturated fat and by increasing her vigorous exercise.   Her blood counts and blood chemistries were all normal.   I am checking with the lab to see if they can do a rubella titer to see if she needs a booster vaccine prior to trying for a family.  Her pap is not back yet.   Cc- Claudette LawsAmanda Dixon

## 2016-05-08 NOTE — Telephone Encounter (Signed)
Left message to call Estephany Perot at [phone]. 

## 2016-05-10 MED ORDER — METRONIDAZOLE 0.75 % VA GEL: 1.0000 | Freq: Every day | VAGINAL | 0 refills | Status: DC

## 2016-05-10 NOTE — Telephone Encounter (Signed)
Left message to call Clea Dubach at [phone].  Notes Recorded by Patton SallesBrook E Amundson C Silva, MD on 05/09/2016 at 5:22 PM EDT Please contact patient regarding her pap which was abnormal.  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  She does not have cancer. I am recommending a colposcopy with me to confirm her diagnosis and make a plan of care.  She will need treatment for bacterial vaginosis prior to the colposcopy.  She may choose either Flagyl 500 mg po bid for 7 days or Metrogel pv at hs for 5 nights.  Please send to pharmacy of choice and give ETOH precautions.  No intercourse during treatment.   I recommend she prevent pregnancy until the colposcopy results are finalized and we have a plan.   I am also asking the lab to do a Rubella titer from the blood we drew the other day to understand if she needs a booster for [place] measles prior to trying for her family.  This is still pending.   Cc- Claudette LawsAmanda Dixon

## 2016-05-10 NOTE — Telephone Encounter (Signed)
Patient returning your call.

## 2016-05-10 NOTE — Telephone Encounter (Signed)
Spoke with patient. Advised of all results as seen below from Dr.Silva. Patient is agreeable and verbalizes understanding. Patient started her menses on 05/07/2016. Patient is going out of town on 05/18/2016. Colposcopy appointment scheduled for 05/15/2016 at 3:30 pm with Dr.Silva.  Instructions given. Motrin 800 mg po x , one hour before appointment with food. Make sure to eat a meal before appointment and drink plenty of fluids. Patient verbalized understanding and will call to reschedule if will be on menses or has any concerns regarding pregnancy. Patient agreeable and verbalized understanding of all instructions. Patient is aware she will need to complete treatment for BV prior to her colposcopy appointment. Rx for Metrogel place 1 applicator at bedtime x 5 days sent to pharmacy on file. Aware she will need to start treatment tonight to finish in time for her colposcopy. ETOH precautions given. Colposcopy ordered for precert.  Routing to provider for final review. Patient agreeable to disposition. Will close encounter.

## 2016-05-12 NOTE — Telephone Encounter (Signed)
Called patient to review benefits for a recommended procedure. Left Voicemail requesting a call back. °

## 2016-05-13 LAB — RUBELLA ANTIBODY, IGM: Rubella IgM: <20 AU/mL (ref <20.00)

## 2016-05-14 ENCOUNTER — Other Ambulatory Visit: Payer: Self-pay | Admitting: Obstetrics and Gynecology

## 2016-05-14 DIAGNOSIS — Z3169 Encounter for other general counseling and advice on procreation: Secondary | ICD-10-CM

## 2016-05-15 ENCOUNTER — Encounter: Payer: Self-pay | Admitting: Obstetrics and Gynecology

## 2016-05-15 ENCOUNTER — Ambulatory Visit (INDEPENDENT_AMBULATORY_CARE_PROVIDER_SITE_OTHER): Payer: 59 | Admitting: Obstetrics and Gynecology

## 2016-05-15 DIAGNOSIS — Z3169 Encounter for other general counseling and advice on procreation: Secondary | ICD-10-CM | POA: Diagnosis not present

## 2016-05-15 DIAGNOSIS — R87612 Low grade squamous intraepithelial lesion on cytologic smear of cervix (LGSIL): Secondary | ICD-10-CM | POA: Diagnosis not present

## 2016-05-15 LAB — POCT URINE PREGNANCY: PREG TEST UR: NEGATIVE

## 2016-05-15 NOTE — Progress Notes (Signed)
Subjective:     Patient ID: Ruth BerkshireJessica A Bailey, female   DOB: 24-Jun-1986, 30 y.o.   MRN: [id number]  HPI  Patient here today for colposcopy. Pap smear 05-05-16 LGSIL. Took Metrogel for BV. Last dose was last hs.   Had blood drawn today for Rubella titer for future pregnancy planning.  Review of Systems  LMP: 05/06/16 Contraception: None UPT: Negative     Objective:   Physical Exam  Genitourinary:     Colposcopy Consent for procedure Speculum placed.  3% acetic acid used.  Colposcopy satisfactory.  Thin rim acetowhite change around cervix, more prominent inferiorly.  Biopsy at 6:00 to pathology.  ECC to pathology.  Monsel's placed.  Minimal EBL.  No complications.    Assessment:     LGSIL pap. HPV changes of cervix.    Plan:     Discussion of HPV, abnormal paps, colposcopy, and LEEP procedure.  Follow up biopsy results.  Colposcopy instructions and precautions given.  Plan will be at least for pap in 12 months.  After visit summary to patient.

## 2016-05-15 NOTE — Patient Instructions (Signed)

## 2016-05-16 LAB — RUBELLA SCREEN: Rubella: 1.08 Index — ABNORMAL HIGH (ref <0.90)

## 2016-05-17 ENCOUNTER — Encounter: Payer: Self-pay | Admitting: Obstetrics and Gynecology

## 2016-05-17 LAB — IPS OTHER TISSUE BIOPSY

## 2016-05-18 ENCOUNTER — Telehealth: Payer: Self-pay | Admitting: *Deleted

## 2016-05-18 NOTE — Telephone Encounter (Signed)
-----   Message from Patton SallesBrook E Amundson C Silva, MD sent at 05/17/2016  9:53 PM EDT ----- Please contact patient regarding her colposcopy results showing LGSIL.  These are abnormal cells but they do not require treatment.  This is not cancer.  This will not interfere with plans for future pregnancy.   I am recommending pap in one year. Please enter recall - 08.

## 2016-05-18 NOTE — Telephone Encounter (Signed)
Left message to call Alyscia Carmon at [phone].  

## 2016-05-18 NOTE — Telephone Encounter (Signed)
Spoke with patient, advised as seen below per Dr. Edward JollySilva. Patient verbalizes understanding and is agreeable. 08 recall placed.  Routing to provider for final review. Patient is agreeable to disposition. Will close encounter.

## 2016-05-19 NOTE — Telephone Encounter (Signed)
Patient did not return call, but was seen for procedure on  05/15/16. Ok to close encounter

## 2016-10-16 ENCOUNTER — Encounter: Payer: Self-pay | Admitting: Obstetrics and Gynecology

## 2016-10-16 ENCOUNTER — Ambulatory Visit (INDEPENDENT_AMBULATORY_CARE_PROVIDER_SITE_OTHER): Payer: 59 | Admitting: Obstetrics and Gynecology

## 2016-10-16 VITALS — BP 116/70 | HR 76 | Ht 69.0 in | Wt 194.0 lb

## 2016-10-16 DIAGNOSIS — N926 Irregular menstruation, unspecified: Secondary | ICD-10-CM | POA: Diagnosis not present

## 2016-10-16 LAB — POCT URINE PREGNANCY: PREG TEST UR: POSITIVE — AB

## 2016-10-16 NOTE — Patient Instructions (Signed)
Consider buying the book What to Expect When Expecting. Another popular book is Pregnancy Week by Week.

## 2016-10-16 NOTE — Progress Notes (Signed)
GYNECOLOGY  VISIT   HPI: 31 y.o.   Married  Caucasian  female   G0P0000 with Patient's last menstrual period was 09/10/2016 (exact date).   here for confirmation of pregnancy. Patient has had several positive home pregnancy tests.    Some fatigue and breast tenderness.   Taking PNV.  Mild cramping on the right side.  Started last week when she had a positive UPT.  The cramping is sporadic.   UPT: Positive  GYNECOLOGIC HISTORY: Patient's last menstrual period was 09/10/2016 (exact date). Contraception:  none Menopausal hormone therapy:  n/a Last mammogram:  n/a Last pap smear:   05-05-16 LGSIL;colposcopy revealed LGSIL;10-170-14 Neg        OB History    Gravida Para Term Preterm AB Living   1 0 0 0 0 0   SAB TAB Ectopic Multiple Live Births   0 0 0 0 0         There are no active problems to display for this patient.   Past Medical History:  Diagnosis Date  . Dysmenorrhea    better on [name]    History reviewed. No pertinent surgical history.  Current Outpatient Prescriptions  Medication Sig Dispense Refill  . cetirizine (ZYRTEC) 10 MG tablet Take 10 mg by mouth daily.    . Prenatal Vit-Fe Fumarate-FA (PRENATAL VITAMIN PO) Take 1 tablet by mouth daily.     No current facility-administered medications for this visit.      ALLERGIES: Latex  Family History  Problem Relation Age of Onset  . Cancer Maternal Grandfather     lung cancer  . Cancer Paternal Grandmother 4172    bone cancer    Social History   Social History  . Marital status: Married    Spouse name: N/A  . Number of children: 0  . Years of education: N/A   Occupational History  . Not on file.   Social History Main Topics  . Smoking status: Former Smoker    Packs/day: 0.25    Years: 4.00    Types: Cigarettes    Quit date: 11/07/2012  . Smokeless tobacco: Never Used  . Alcohol use No  . Drug use: No  . Sexual activity: Yes    Partners: Male    Birth control/ protection: None   Other  Topics Concern  . Not on file   Social History Narrative  . No narrative on file    ROS:  Pertinent items are noted in HPI.  PHYSICAL EXAMINATION:    BP 116/70 (BP Location: Right Arm, Patient Position: Sitting, Cuff Size: Normal)   Pulse 76   Ht 5\' 9"  (1.753 m)   Wt 194 lb (88 kg)   LMP 09/10/2016 (Exact Date)   BMI 28.65 kg/m     General appearance: alert, cooperative and appears stated age  Pelvic: External genitalia:  no lesions              Urethra:  normal appearing urethra with no masses, tenderness or lesions              Bartholins and Skenes: normal                 Vagina: normal appearing vagina with normal color and discharge, no lesions              Cervix: no lesions                Bimanual Exam:  Uterus:  normal size, contour, position, consistency,  mobility, non-tender              Adnexa: no mass, fullness, tenderness             Chaperone was present for exam.  ASSESSMENT  Early pregnancy.  5 +0 weeks by LMP.  RLQ cramping.  No bleeding.   PLAN  Dicussed early pregnancy.  Reviewed avoidance of exposures and general activity guidelines.  Pelvic ectopic pregnancy precautions.  Will check quant hCG today and on 10/19/16.  Plan for potential ultrasound next week.  Discussed reading material for pregnancy care.    An After Visit Summary was printed and given to the patient.  __25___ minutes face to face time of which over 50% was spent in counseling.

## 2016-10-17 ENCOUNTER — Other Ambulatory Visit: Payer: Self-pay | Admitting: *Deleted

## 2016-10-17 DIAGNOSIS — Z3201 Encounter for pregnancy test, result positive: Secondary | ICD-10-CM

## 2016-10-17 DIAGNOSIS — N926 Irregular menstruation, unspecified: Secondary | ICD-10-CM

## 2016-10-17 LAB — HCG, QUANTITATIVE, PREGNANCY: hCG, Beta Chain, Quant, S: 4956.3 m[IU]/mL — ABNORMAL HIGH

## 2016-10-18 ENCOUNTER — Other Ambulatory Visit: Payer: 59

## 2016-10-19 ENCOUNTER — Ambulatory Visit (INDEPENDENT_AMBULATORY_CARE_PROVIDER_SITE_OTHER): Payer: 59

## 2016-10-19 ENCOUNTER — Ambulatory Visit (INDEPENDENT_AMBULATORY_CARE_PROVIDER_SITE_OTHER): Payer: 59 | Admitting: Obstetrics and Gynecology

## 2016-10-19 ENCOUNTER — Encounter: Payer: Self-pay | Admitting: Obstetrics and Gynecology

## 2016-10-19 VITALS — BP 108/68 | HR 80 | Ht 69.0 in | Wt 194.0 lb

## 2016-10-19 DIAGNOSIS — N926 Irregular menstruation, unspecified: Secondary | ICD-10-CM | POA: Diagnosis not present

## 2016-10-19 DIAGNOSIS — Z3201 Encounter for pregnancy test, result positive: Secondary | ICD-10-CM

## 2016-10-19 DIAGNOSIS — Z3490 Encounter for supervision of normal pregnancy, unspecified, unspecified trimester: Secondary | ICD-10-CM | POA: Diagnosis not present

## 2016-10-19 NOTE — Progress Notes (Signed)
Encounter reviewed by Dr. Onetta Spainhower Amundson C. Silva.  

## 2016-10-19 NOTE — Progress Notes (Signed)
Patient ID: Ruth BerkshireJessica A Matto, female   DOB: 12-Jun-1986, 31 y.o.   MRN: [id number] GYNECOLOGY  VISIT   HPI: 31 y.o.   Married  Caucasian  female   G1P0000 with Patient's last menstrual period was 09/10/2016 (exact date).   here for pelvic ultrasound for viability of pregnancy.  Very excited about the pregnancy.  Has had some mild sporadic right sided cramping. HCG 4956.3 on 10/16/16.  Going to the beach for a week.   GYNECOLOGIC HISTORY: Patient's last menstrual period was 09/10/2016 (exact date). Contraception:  none Menopausal hormone therapy:  n/a Last mammogram:  n/a Last pap smear:   05-05-16 LGSIL;colposcopy revealed LGSIL, pap 05-09-13 Neg        OB History    Gravida Para Term Preterm AB Living   1 0 0 0 0 0   SAB TAB Ectopic Multiple Live Births   0 0 0 0 0         There are no active problems to display for this patient.   Past Medical History:  Diagnosis Date  . Dysmenorrhea    better on [name]    No past surgical history on file.  Current Outpatient Prescriptions  Medication Sig Dispense Refill  . cetirizine (ZYRTEC) 10 MG tablet Take 10 mg by mouth daily.    . Prenatal Vit-Fe Fumarate-FA (PRENATAL VITAMIN PO) Take 1 tablet by mouth daily.     No current facility-administered medications for this visit.      ALLERGIES: Latex  Family History  Problem Relation Age of Onset  . Cancer Maternal Grandfather     lung cancer  . Cancer Paternal Grandmother 5172    bone cancer    Social History   Social History  . Marital status: Married    Spouse name: N/A  . Number of children: 0  . Years of education: N/A   Occupational History  . Not on file.   Social History Main Topics  . Smoking status: Former Smoker    Packs/day: 0.25    Years: 4.00    Types: Cigarettes    Quit date: 11/07/2012  . Smokeless tobacco: Never Used  . Alcohol use No  . Drug use: No  . Sexual activity: Yes    Partners: Male    Birth control/ protection: None   Other Topics  Concern  . Not on file   Social History Narrative  . No narrative on file    ROS:  Pertinent items are noted in HPI.  PHYSICAL EXAMINATION:    BP 108/68 (BP Location: Right Arm, Patient Position: Sitting, Cuff Size: Normal)   Pulse 80   Ht 5\' 9"  (1.753 m)   Wt 194 lb (88 kg)   LMP 09/10/2016 (Exact Date)   BMI 28.65 kg/m     General appearance: alert, cooperative and appears stated age    Pelvic ultrasound: Uterus with no fibroids. IUP size equal dates.  Fetal pole seen but too early for cardiac activity. 5 +6 weeks. Normal cervix. Small bilateral CL cysts. No free fluid.   ASSESSMENT  Early IUP confirmed. Bilateral CL cysts.  PLAN  Discussion of early pregnancy and development. Will plan to repeat ultrasound again in about 2 weeks when patient returns from her trip.  I expect to see cardiac activity then. Patient will follow up with OB team after viability is confirmed.   An After Visit Summary was printed and given to the patient.  __15____ minutes face to face time of which over 50%  was spent in counseling.

## 2016-11-03 ENCOUNTER — Telehealth: Payer: Self-pay | Admitting: Obstetrics and Gynecology

## 2016-11-03 NOTE — Telephone Encounter (Signed)
Called patient to review benefits for a recommended procedure. Left Voicemail requesting a call back. °

## 2016-11-07 NOTE — Telephone Encounter (Signed)
Called patient to review benefits for a recommended ultrasound. Left Voicemail requesting a call back. °

## 2016-11-17 LAB — OB RESULTS CONSOLE HIV ANTIBODY (ROUTINE TESTING): HIV: NONREACTIVE

## 2016-11-17 LAB — OB RESULTS CONSOLE ABO/RH: RH TYPE: POSITIVE

## 2016-11-17 LAB — OB RESULTS CONSOLE ANTIBODY SCREEN: ANTIBODY SCREEN: NEGATIVE

## 2016-11-17 LAB — OB RESULTS CONSOLE GC/CHLAMYDIA
Chlamydia: NEGATIVE
GC PROBE AMP, GENITAL: NEGATIVE

## 2016-11-17 LAB — OB RESULTS CONSOLE HEPATITIS B SURFACE ANTIGEN: Hepatitis B Surface Ag: NEGATIVE

## 2016-11-17 LAB — OB RESULTS CONSOLE RPR: RPR: NONREACTIVE

## 2016-11-17 LAB — OB RESULTS CONSOLE RUBELLA ANTIBODY, IGM: RUBELLA: IMMUNE

## 2017-04-25 ENCOUNTER — Inpatient Hospital Stay (HOSPITAL_COMMUNITY): Admission: AD | Admit: 2017-04-25 | Payer: 59 | Source: Ambulatory Visit | Admitting: Obstetrics and Gynecology

## 2017-06-05 ENCOUNTER — Ambulatory Visit (INDEPENDENT_AMBULATORY_CARE_PROVIDER_SITE_OTHER): Payer: Self-pay | Admitting: Pediatrics

## 2017-06-05 DIAGNOSIS — Z7681 Expectant parent(s) prebirth pediatrician visit: Secondary | ICD-10-CM

## 2017-06-05 NOTE — Progress Notes (Signed)
Prenatal counseling for impending newborn done. Due date 06/17/2017. Mom denies any drugs, ETOH during pregnancy. No pregnancy complications.

## 2017-06-18 ENCOUNTER — Encounter (HOSPITAL_COMMUNITY): Payer: Self-pay | Admitting: *Deleted

## 2017-06-18 ENCOUNTER — Telehealth (HOSPITAL_COMMUNITY): Payer: Self-pay | Admitting: *Deleted

## 2017-06-18 LAB — OB RESULTS CONSOLE GBS: GBS: NEGATIVE

## 2017-06-18 NOTE — Telephone Encounter (Signed)
Preadmission screen  

## 2017-06-25 ENCOUNTER — Encounter (HOSPITAL_COMMUNITY): Payer: Self-pay | Admitting: Anesthesiology

## 2017-06-25 ENCOUNTER — Inpatient Hospital Stay (HOSPITAL_COMMUNITY): Payer: 59 | Admitting: Anesthesiology

## 2017-06-25 ENCOUNTER — Encounter (HOSPITAL_COMMUNITY): Payer: Self-pay

## 2017-06-25 ENCOUNTER — Inpatient Hospital Stay (HOSPITAL_COMMUNITY)
Admission: RE | Admit: 2017-06-25 | Discharge: 2017-06-27 | DRG: 805 | Disposition: A | Discharge status: Home / Self Care | Payer: 59 | Source: Ambulatory Visit | Attending: Obstetrics and Gynecology | Admitting: Obstetrics and Gynecology

## 2017-06-25 ENCOUNTER — Other Ambulatory Visit: Payer: Self-pay

## 2017-06-25 DIAGNOSIS — O411230 Chorioamnionitis, third trimester, not applicable or unspecified: Secondary | ICD-10-CM | POA: Diagnosis present

## 2017-06-25 DIAGNOSIS — Z87891 Personal history of nicotine dependence: Secondary | ICD-10-CM | POA: Diagnosis not present

## 2017-06-25 DIAGNOSIS — Z3A41 41 weeks gestation of pregnancy: Secondary | ICD-10-CM

## 2017-06-25 DIAGNOSIS — O480 Post-term pregnancy: Principal | ICD-10-CM | POA: Diagnosis present

## 2017-06-25 LAB — CBC
HEMATOCRIT: 38.3 % (ref 36.0–46.0)
Hemoglobin: 13.2 g/dL (ref 12.0–15.0)
MCH: 29.9 pg (ref 26.0–34.0)
MCHC: 34.5 g/dL (ref 30.0–36.0)
MCV: 86.8 fL (ref 78.0–100.0)
PLATELETS: 315 10*3/uL (ref 150–400)
RBC: 4.41 MIL/uL (ref 3.87–5.11)
RDW: 13.9 % (ref 11.5–15.5)
WBC: 13.8 10*3/uL — AB (ref 4.0–10.5)

## 2017-06-25 LAB — TYPE AND SCREEN
ABO/RH(D): A POS
ANTIBODY SCREEN: NEGATIVE

## 2017-06-25 LAB — ABO/RH: ABO/RH(D): A POS

## 2017-06-25 LAB — RPR: RPR: NONREACTIVE

## 2017-06-25 MED ORDER — GENTAMICIN SULFATE 40 MG/ML IJ SOLN
220.0000 mg | Freq: Three times a day (TID) | INTRAMUSCULAR | Status: DC
  Filled 2017-06-25: qty 5.5

## 2017-06-25 MED ORDER — OXYCODONE-ACETAMINOPHEN 5-325 MG PO TABS: 1.0000 | ORAL_TABLET | ORAL | Status: DC | PRN

## 2017-06-25 MED ORDER — IBUPROFEN 600 MG PO TABS
600.0000 mg | ORAL_TABLET | Freq: Four times a day (QID) | ORAL | Status: DC
  Administered 2017-06-25 – 2017-06-27 (×7): 600 mg via ORAL
  Filled 2017-06-25 (×7): qty 1

## 2017-06-25 MED ORDER — CLINDAMYCIN PHOSPHATE 900 MG/50ML IV SOLN: 900.0000 mg | Freq: Three times a day (TID) | INTRAVENOUS | Status: DC

## 2017-06-25 MED ORDER — PHENYLEPHRINE 40 MCG/ML (10ML) SYRINGE FOR IV PUSH (FOR BLOOD PRESSURE SUPPORT): 80.0000 ug | PREFILLED_SYRINGE | INTRAVENOUS | Status: DC | PRN

## 2017-06-25 MED ORDER — SOD CITRATE-CITRIC ACID 500-334 MG/5ML PO SOLN: 30.0000 mL | ORAL | Status: DC | PRN

## 2017-06-25 MED ORDER — MEDROXYPROGESTERONE ACETATE 150 MG/ML IM SUSP: 150.0000 mg | INTRAMUSCULAR | Status: DC | PRN

## 2017-06-25 MED ORDER — TERBUTALINE SULFATE 1 MG/ML IJ SOLN
0.2500 mg | Freq: Once | INTRAMUSCULAR | Status: DC | PRN
  Filled 2017-06-25: qty 1

## 2017-06-25 MED ORDER — MEASLES, MUMPS & RUBELLA VAC ~~LOC~~ INJ: 0.5000 mL | INJECTION | Freq: Once | SUBCUTANEOUS | Status: DC

## 2017-06-25 MED ORDER — EPHEDRINE 5 MG/ML INJ: 10.0000 mg | INTRAVENOUS | Status: DC | PRN

## 2017-06-25 MED ORDER — ONDANSETRON HCL 4 MG/2ML IJ SOLN: 4.0000 mg | INTRAMUSCULAR | Status: DC | PRN

## 2017-06-25 MED ORDER — LACTATED RINGERS IV SOLN
INTRAVENOUS | Status: DC
  Administered 2017-06-25 (×3): via INTRAVENOUS

## 2017-06-25 MED ORDER — LIDOCAINE HCL (PF) 1 % IJ SOLN
30.0000 mL | INTRAMUSCULAR | Status: DC | PRN
  Filled 2017-06-25: qty 30

## 2017-06-25 MED ORDER — PRENATAL MULTIVITAMIN CH
1.0000 | ORAL_TABLET | Freq: Every day | ORAL | Status: DC
  Administered 2017-06-26 – 2017-06-27 (×2): 1 via ORAL
  Filled 2017-06-25 (×2): qty 1

## 2017-06-25 MED ORDER — FENTANYL 2.5 MCG/ML BUPIVACAINE 1/10 % EPIDURAL INFUSION (WH - ANES)
14.0000 mL/h | INTRAMUSCULAR | Status: DC | PRN
  Administered 2017-06-25: 14 mL/h via EPIDURAL
  Filled 2017-06-25: qty 100

## 2017-06-25 MED ORDER — ONDANSETRON HCL 4 MG PO TABS
4.0000 mg | ORAL_TABLET | ORAL | Status: DC | PRN
Start: 2017-06-25 — End: 2017-06-27

## 2017-06-25 MED ORDER — ONDANSETRON HCL 4 MG/2ML IJ SOLN: 4.0000 mg | Freq: Four times a day (QID) | INTRAMUSCULAR | Status: DC | PRN

## 2017-06-25 MED ORDER — LACTATED RINGERS IV SOLN
500.0000 mL | Freq: Once | INTRAVENOUS | Status: AC
  Administered 2017-06-25: 500 mL via INTRAVENOUS

## 2017-06-25 MED ORDER — SODIUM CHLORIDE 0.9 % IV SOLN
2.0000 g | Freq: Four times a day (QID) | INTRAVENOUS | Status: AC
  Administered 2017-06-25 – 2017-06-26 (×4): 2 g via INTRAVENOUS
  Filled 2017-06-25 (×4): qty 2000

## 2017-06-25 MED ORDER — OXYTOCIN 40 UNITS IN LACTATED RINGERS INFUSION - SIMPLE MED: 1.0000 m[IU]/min | INTRAVENOUS | Status: DC

## 2017-06-25 MED ORDER — LACTATED RINGERS IV SOLN: 500.0000 mL | Freq: Once | INTRAVENOUS | Status: DC

## 2017-06-25 MED ORDER — EPHEDRINE 5 MG/ML INJ
10.0000 mg | INTRAVENOUS | Status: DC | PRN
  Filled 2017-06-25: qty 2

## 2017-06-25 MED ORDER — OXYTOCIN BOLUS FROM INFUSION
500.0000 mL | Freq: Once | INTRAVENOUS | Status: AC
  Administered 2017-06-25: 500 mL via INTRAVENOUS

## 2017-06-25 MED ORDER — PHENYLEPHRINE 40 MCG/ML (10ML) SYRINGE FOR IV PUSH (FOR BLOOD PRESSURE SUPPORT)
80.0000 ug | PREFILLED_SYRINGE | INTRAVENOUS | Status: DC | PRN
  Filled 2017-06-25: qty 5
  Filled 2017-06-25: qty 10

## 2017-06-25 MED ORDER — SIMETHICONE 80 MG PO CHEW: 80.0000 mg | CHEWABLE_TABLET | ORAL | Status: DC | PRN

## 2017-06-25 MED ORDER — DIPHENHYDRAMINE HCL 50 MG/ML IJ SOLN: 12.5000 mg | INTRAMUSCULAR | Status: DC | PRN

## 2017-06-25 MED ORDER — GENTAMICIN SULFATE 40 MG/ML IJ SOLN
Freq: Three times a day (TID) | INTRAVENOUS | Status: AC
  Administered 2017-06-25 – 2017-06-26 (×3): via INTRAVENOUS
  Filled 2017-06-25 (×3): qty 5.5

## 2017-06-25 MED ORDER — DIPHENHYDRAMINE HCL 25 MG PO CAPS: 25.0000 mg | ORAL_CAPSULE | Freq: Four times a day (QID) | ORAL | Status: DC | PRN

## 2017-06-25 MED ORDER — WITCH HAZEL-GLYCERIN EX PADS: 1.0000 "application " | MEDICATED_PAD | CUTANEOUS | Status: DC | PRN

## 2017-06-25 MED ORDER — LIDOCAINE HCL (PF) 1 % IJ SOLN
INTRAMUSCULAR | Status: DC | PRN
  Administered 2017-06-25: 5 mL via EPIDURAL

## 2017-06-25 MED ORDER — TETANUS-DIPHTH-ACELL PERTUSSIS 5-2.5-18.5 LF-MCG/0.5 IM SUSP: 0.5000 mL | Freq: Once | INTRAMUSCULAR | Status: DC

## 2017-06-25 MED ORDER — PHENYLEPHRINE 40 MCG/ML (10ML) SYRINGE FOR IV PUSH (FOR BLOOD PRESSURE SUPPORT)
80.0000 ug | PREFILLED_SYRINGE | INTRAVENOUS | Status: DC | PRN
  Filled 2017-06-25: qty 5

## 2017-06-25 MED ORDER — DIPHENHYDRAMINE HCL 50 MG/ML IJ SOLN
12.5000 mg | INTRAMUSCULAR | Status: DC | PRN
  Administered 2017-06-25: 12.5 mg via INTRAVENOUS
  Filled 2017-06-25: qty 1

## 2017-06-25 MED ORDER — SENNOSIDES-DOCUSATE SODIUM 8.6-50 MG PO TABS
2.0000 | ORAL_TABLET | ORAL | Status: DC
  Administered 2017-06-26 – 2017-06-27 (×2): 2 via ORAL
  Filled 2017-06-25 (×2): qty 2

## 2017-06-25 MED ORDER — OXYTOCIN 40 UNITS IN LACTATED RINGERS INFUSION - SIMPLE MED
1.0000 m[IU]/min | INTRAVENOUS | Status: DC
  Administered 2017-06-25: 1 m[IU]/min via INTRAVENOUS

## 2017-06-25 MED ORDER — LACTATED RINGERS IV SOLN
500.0000 mL | INTRAVENOUS | Status: DC | PRN
  Administered 2017-06-25: 500 mL via INTRAVENOUS

## 2017-06-25 MED ORDER — COCONUT OIL OIL: 1.0000 "application " | TOPICAL_OIL | Status: DC | PRN

## 2017-06-25 MED ORDER — MISOPROSTOL 25 MCG QUARTER TABLET
25.0000 ug | ORAL_TABLET | ORAL | Status: DC | PRN
  Administered 2017-06-25 (×2): 25 ug via VAGINAL
  Filled 2017-06-25 (×3): qty 1

## 2017-06-25 MED ORDER — OXYTOCIN 40 UNITS IN LACTATED RINGERS INFUSION - SIMPLE MED
2.5000 [IU]/h | INTRAVENOUS | Status: DC
  Filled 2017-06-25: qty 1000

## 2017-06-25 MED ORDER — OXYCODONE-ACETAMINOPHEN 5-325 MG PO TABS: 2.0000 | ORAL_TABLET | ORAL | Status: DC | PRN

## 2017-06-25 MED ORDER — ACETAMINOPHEN 325 MG PO TABS
650.0000 mg | ORAL_TABLET | ORAL | Status: DC | PRN
  Administered 2017-06-25 – 2017-06-26 (×3): 650 mg via ORAL
  Filled 2017-06-25 (×3): qty 2

## 2017-06-25 MED ORDER — DIBUCAINE 1 % RE OINT
1.0000 | TOPICAL_OINTMENT | RECTAL | Status: DC | PRN
Start: 2017-06-25 — End: 2017-06-27

## 2017-06-25 MED ORDER — BENZOCAINE-MENTHOL 20-0.5 % EX AERO
1.0000 | INHALATION_SPRAY | CUTANEOUS | Status: DC | PRN
Start: 2017-06-25 — End: 2017-06-27
  Administered 2017-06-25: 1 via TOPICAL
  Filled 2017-06-25: qty 56

## 2017-06-25 MED ORDER — ACETAMINOPHEN 325 MG PO TABS: 650.0000 mg | ORAL_TABLET | ORAL | Status: DC | PRN

## 2017-06-25 NOTE — Progress Notes (Signed)
Pt feeling more pressure w/ each contraction  FHT cat 2 toco Q1-2 Cvx 9.5/C/+1  A/P:  Continue exp mngt

## 2017-06-25 NOTE — Progress Notes (Signed)
Pharmacy Antibiotic Note  Ruth BerkshireJessica A Bailey is a 31 y.o. female admitted on 06/25/2017 for IOL at 41+ weeks gestation.  Pharmacy has been consulted for Gentamicin dosing due to maternal temp during labor. Abx started to r/o chorioamnionitis (Triple I)  Plan: Gentamicin 220mg  IV q8h Will continue to follow to assess need for further labs or kinetic workup  Height: 5\' 11"  (180.3 cm) Weight: 230 lb (104.3 kg) IBW/kg (Calculated) : 70.8  Adjusted (Dosing) BW: 80.9kg  Temp (24hrs), Avg:98.6 F (37 C), Min:97.7 F (36.5 C), Max:101.6 F (38.7 C)  Recent Labs  Lab 06/25/17 0038  WBC 13.8*    CrCl cannot be calculated (Patient's most recent lab result is older than the maximum 21 days allowed.).    Allergies  Allergen Reactions  . Latex Hives    Antimicrobials this admission: Ampicillin 2 gram IV q6h  12/3 >>  Dose adjustments this admission:  Microbiology results:   Thank you for allowing pharmacy to be a part of this patient's care.  Claybon Jabsngel, Lexys Milliner G 06/25/2017 6:54 PM

## 2017-06-25 NOTE — H&P (Signed)
Bethann BerkshireJessica A Roger is a 31 y.o. female G1 @ 41+1 wks presenting for postdates IOL.  Pregnancy uncomplicated.  S/p cytotec x 2 overnight.  OB History    Gravida Para Term Preterm AB Living   1 0 0 0 0 0   SAB TAB Ectopic Multiple Live Births   0 0 0 0 0     Past Medical History:  Diagnosis Date  . Dysmenorrhea    better on [name]  . Headache   . Vaginal Pap smear, abnormal    Past Surgical History:  Procedure Laterality Date  . COLPOSCOPY     Family History: family history includes Cancer in her maternal grandfather; Cancer (age of onset: 6372) in her paternal grandmother. Social History:  reports that she quit smoking about 4 years ago. Her smoking use included cigarettes. She has a 1.00 pack-year smoking history. she has never used smokeless tobacco. She reports that she does not drink alcohol or use drugs.     Maternal Diabetes: No Genetic Screening: Normal Maternal Ultrasounds/Referrals: Normal Fetal Ultrasounds or other Referrals:  None Maternal Substance Abuse:  No Significant Maternal Medications:  None Significant Maternal Lab Results:  None Other Comments:  None  ROS History Dilation: 1.5 Effacement (%): 50 Station: -3 Exam by:: Minus Libertyhristy Leshowitz, RN Blood pressure (!) 138/95, pulse 92, temperature 97.9 F (36.6 C), temperature source Oral, resp. rate 18, height 5\' 11"  (1.803 m), weight 230 lb (104.3 kg), last menstrual period 09/10/2016, SpO2 98 %. Exam Physical Exam  Gen - NAD ABd - gravid, NT Ext - NT, no edema Cvx 2/60/-2 AROM - clear IUPC placed Prenatal labs: ABO, Rh: --/--/A POS, A POS (12/03 0038) Antibody: NEG (12/03 0038) Rubella: Immune (04/27 0000) RPR: Nonreactive (04/27 0000)  HBsAg: Negative (04/27 0000)  HIV: Non-reactive (04/27 0000)  GBS: Negative (11/26 0000)   Assessment/Plan: Admit Pitocin Epidural prn    Zelphia CairoGretchen Jariana Shumard 06/25/2017, 9:50 AM

## 2017-06-25 NOTE — Progress Notes (Signed)
Pt comfortable w/ epidural  FHT good variability w/ occasional variables and occasional late decels Good scalp stim w/ exam Toco Q1-3 Cvx 8-9cm/C/0  A/P:  Continue exp mngt Position change

## 2017-06-25 NOTE — Progress Notes (Signed)
Pt with temp 100.9 after delivery rec amp,gent,clinda - order given and discussed with RN

## 2017-06-25 NOTE — Progress Notes (Signed)
Pt pushing > 2.5 hours and exhausted.  Offered vacuum assist and pt agrees to proceed Foley catheter removed and Bell vacuum applied between pushes Gentle traction applied with next 3 pushes and good movement noted Pt pushed without vacuum on next 2 contractions Vacuum reapplied and infant delivered with next push Apgars 8,9 Infant taken to warmer for blow by O2 with RN 2nd degree laceration repaired with 3-0 vicyrl rapide Fundus firm,  EBL 200cc

## 2017-06-25 NOTE — Anesthesia Procedure Notes (Signed)
Epidural Patient location during procedure: OB Start time: 06/25/2017 10:07 AM End time: 06/25/2017 10:19 AM  Staffing Anesthesiologist: Achille RichHodierne, Nason Conradt, MD Performed: anesthesiologist   Preanesthetic Checklist Completed: patient identified, site marked, pre-op evaluation, timeout performed, IV checked, risks and benefits discussed and monitors and equipment checked  Epidural Patient position: sitting Prep: DuraPrep Patient monitoring: heart rate, cardiac monitor, continuous pulse ox and blood pressure Approach: midline Location: L2-L3 Injection technique: LOR saline  Needle:  Needle type: Tuohy  Needle gauge: 17 G Needle length: 9 cm Needle insertion depth: 7 cm Catheter type: closed end flexible Catheter size: 19 Gauge Catheter at skin depth: 12 cm Test dose: negative and Other  Assessment Events: blood not aspirated, injection not painful, no injection resistance and negative IV test  Additional Notes Informed consent obtained prior to proceeding including risk of failure, 1% risk of PDPH, risk of minor discomfort and bruising.  Discussed rare but serious complications including epidural abscess, permanent nerve injury, epidural hematoma.  Discussed alternatives to epidural analgesia and patient desires to proceed.  Timeout performed pre-procedure verifying patient name, procedure, and platelet count.  Patient tolerated procedure well. Reason for block:procedure for pain

## 2017-06-25 NOTE — Progress Notes (Signed)
Pt comfortable w/ epidural.  Not on pitocin  FHT good variability but occasional late decels. Good scalp stimulation with my exam to 147 Toco Q 2-4 Cvx 4/90/-2  A/P:  Continue to watch FHT closely.

## 2017-06-25 NOTE — Anesthesia Preprocedure Evaluation (Signed)
Anesthesia Evaluation  Patient identified by MRN, date of birth, ID band Patient awake    Reviewed: Allergy & Precautions, H&P , NPO status , Patient's Chart, lab work & pertinent test results  Airway Mallampati: II   Neck ROM: full    Dental   Pulmonary former smoker,    breath sounds clear to auscultation       Cardiovascular negative cardio ROS   Rhythm:regular Rate:Normal     Neuro/Psych  Headaches,    GI/Hepatic   Endo/Other    Renal/GU      Musculoskeletal   Abdominal   Peds  Hematology   Anesthesia Other Findings   Reproductive/Obstetrics (+) Pregnancy                             Anesthesia Physical Anesthesia Plan  ASA: II  Anesthesia Plan: Epidural   Post-op Pain Management:    Induction: Intravenous  PONV Risk Score and Plan: 2 and Treatment may vary due to age or medical condition  Airway Management Planned: Natural Airway  Additional Equipment:   Intra-op Plan:   Post-operative Plan:   Informed Consent: I have reviewed the patients History and Physical, chart, labs and discussed the procedure including the risks, benefits and alternatives for the proposed anesthesia with the patient or authorized representative who has indicated his/her understanding and acceptance.     Plan Discussed with: Anesthesiologist  Anesthesia Plan Comments:         Anesthesia Quick Evaluation

## 2017-06-25 NOTE — Anesthesia Pain Management Evaluation Note (Signed)
  [facility] Pain Management Visit Note  Patient: Ruth Bailey, 31 y.o., female  "Hello I am a member of the anesthesia team at [facility]. We have an anesthesia team available at all times to provide care throughout the hospital, including epidural management and anesthesia for C-section. I don't know your plan for the delivery whether it a natural birth, water birth, IV sedation, nitrous supplementation, doula or epidural, but we want to meet your pain goals."   1.Was your pain managed to your expectations on prior hospitalizations?   No prior hospitalizations  2.What is your expectation for pain management during this hospitalization?     Epidural  3.How can we help you reach that goal? Epidural when desired  Record the patient's initial score and the patient's pain goal.   Pain: 3  Pain Goal: 5 [facility] wants you to be able to say your pain was always managed very well.  Ruth Bailey 06/25/2017

## 2017-06-26 LAB — CBC
HEMATOCRIT: 37.6 % (ref 36.0–46.0)
HEMOGLOBIN: 12.5 g/dL (ref 12.0–15.0)
MCH: 29.6 pg (ref 26.0–34.0)
MCHC: 33.2 g/dL (ref 30.0–36.0)
MCV: 88.9 fL (ref 78.0–100.0)
Platelets: 243 10*3/uL (ref 150–400)
RBC: 4.23 MIL/uL (ref 3.87–5.11)
RDW: 14 % (ref 11.5–15.5)
WBC: 15.6 10*3/uL — AB (ref 4.0–10.5)

## 2017-06-26 NOTE — Progress Notes (Signed)
Post Partum Day 1 Subjective: no complaints, up ad lib, voiding, tolerating PO and + flatus  Objective: Blood pressure 130/81, pulse 69, temperature 98.4 F (36.9 C), temperature source Oral, resp. rate 18, height 5\' 11"  (1.803 m), weight 230 lb (104.3 kg), last menstrual period 09/10/2016, SpO2 98 %, unknown if currently breastfeeding.  Physical Exam:  General: alert, cooperative and no distress Lochia: appropriate Uterine Fundus: firm, nontender Incision: healing well DVT Evaluation: No evidence of DVT seen on physical exam.  Recent Labs    06/25/17 0038 06/26/17 0515  HGB 13.2 12.5  HCT 38.3 37.6    Assessment/Plan: Plan for discharge tomorrow  Continue antibiotics x 24 hours   LOS: 1 day   Roselle LocusJames E Jareb Radoncic II 06/26/2017, 7:52 AM

## 2017-06-26 NOTE — Lactation Note (Signed)
This note was copied from a baby's chart. Lactation Consultation Note  Patient Name: Ruth Bailey GNFAO'ZToday's Date: 06/26/2017 Reason for consult: Follow-up assessment;NICU baby;Term   Follow up with first time mom of 22 hour old infant in NICU. Mom at infant bedside and would like assistance with BF.   Dad brought NS from PP room downstairs. Mom with large compressible breasts and short shaft everted nipples. Areola is semi compressible. Worked with mom on proper application of the NS, positioning and pillow support. Infant would latch and not suckle. Formula was placed in NS and infant would suckle well as long as there was milk in the NS and then would stop. BF attempted for about 15 minutes and then feeding was finished with bottle. Showed mom how to pace bottle feed infant.   Enc mom to offer breast as mom is available and infant willing. If infant not feeding well, attempt for 15-20 minutes and then supplement with EBM or formula.   Mom is pumping with DEBP about every 3 hours. She reports she is getting nothing in the pump but can get some with hand expression. Enc mom to pump every 2-3 hours at 15 minutes with DEBP on Initiate setting and follow with hand expression on both breasts. Reviewed importance of pumping and hand expression and normal progression of milk coming to volume.   Colostrum collection containers given to mom. NS and curved tip syringe left in infant bedside table for mom to use when BF infant in the NICU.   Mom reports she has no further questions/concerns at this time. Enc mom to call for feeding assistance as needed. Mom did well assisting infant with latch.    Maternal Data Formula Feeding for Exclusion: Yes Reason for exclusion: Mother's choice to formula and breast feed on admission Has patient been taught Hand Expression?: Yes Does the patient have breastfeeding experience prior to this delivery?: No  Feeding Feeding Type: Breast Fed  LATCH Score Latch:  Repeated attempts needed to sustain latch, nipple held in mouth throughout feeding, stimulation needed to elicit sucking reflex.  Audible Swallowing: A few with stimulation(with formula in the NS, stopped suckling when formula was gone)  Type of Nipple: Everted at rest and after stimulation(short shaft)  Comfort (Breast/Nipple): Soft / non-tender  Hold (Positioning): Assistance needed to correctly position infant at breast and maintain latch.  LATCH Score: 7  Interventions Interventions: Breast feeding basics reviewed;Support pillows;Assisted with latch;Skin to skin;Breast compression;Hand express  Lactation Tools Discussed/Used Tools: Nipple Shields Nipple shield size: 20 Pump Review: Setup, frequency, and cleaning;Milk Storage Initiated by:: Reviewed and encouraged every 2-3 hours and to follow with hand expression   Consult Status Consult Status: Follow-up Date: 06/27/17 Follow-up type: In-patient    Silas FloodSharon S Andy Moye 06/26/2017, 4:57 PM

## 2017-06-26 NOTE — Lactation Note (Signed)
This note was copied from a baby's chart. Lactation Consultation Note Baby in CN d/t low blood sugar. RN having difficulty getting baby to swallow formula. LC attempted to syring feed w/glvoed finger. Baby needed much stimulation w/suck training. Sitting baby upright stimulating to wake, baby took 5 ml formula w/much work. Sleepy, fussy d/t not wanting to wake up and work to feed.  CN rn caring for baby. Patient Name: Ruth Berton MountJessica Huckaba ZOXWR'UToday's Date: 06/26/2017     Maternal Data    Feeding Feeding Type: Formula  LATCH Score                   Interventions    Lactation Tools Discussed/Used     Consult Status      [name] G 06/26/2017, 7:04 AM

## 2017-06-26 NOTE — Anesthesia Postprocedure Evaluation (Signed)
Anesthesia Post Note  Patient: Ruth Bailey  Procedure(s) Performed: AN AD HOC LABOR EPIDURAL     Patient location during evaluation: Mother Baby Anesthesia Type: Epidural Level of consciousness: awake, awake and alert, oriented and patient cooperative Pain management: pain level controlled Vital Signs Assessment: post-procedure vital signs reviewed and stable Respiratory status: spontaneous breathing, nonlabored ventilation and respiratory function stable Cardiovascular status: stable Postop Assessment: no headache, no backache, patient able to bend at knees and no apparent nausea or vomiting Anesthetic complications: no    Last Vitals:  Vitals:   06/26/17 0200 06/26/17 0515  BP: 115/79 130/81  Pulse: 85 69  Resp: 18 18  Temp: 36.7 C 36.9 C  SpO2:      Last Pain:  Vitals:   06/26/17 0519  TempSrc:   PainSc: 3    Pain Goal:                 Jarrah Seher L

## 2017-06-27 MED ORDER — IBUPROFEN 600 MG PO TABS: 600.0000 mg | ORAL_TABLET | Freq: Four times a day (QID) | ORAL | 0 refills | Status: AC

## 2017-06-27 NOTE — Discharge Instructions (Signed)
Call MD for T>100.4, heavy vaginal bleeding, severe abdominal pain, or respiratory distress.  Call office to schedule postpartum visit in 6 weeks.  Pelvic rest x 6 weeks.   

## 2017-06-27 NOTE — Progress Notes (Signed)
Post Partum Day 2  Subjective: no complaints, up ad lib, voiding and tolerating PO.  Baby in NICU and patient was with baby on AM rounds.  Objective: Blood pressure 132/80, pulse 88, temperature (!) 97.5 F (36.4 C), temperature source Oral, resp. rate 18, height 5\' 11"  (1.803 m), weight 230 lb (104.3 kg), last menstrual period 09/10/2016, SpO2 98 %, unknown if currently breastfeeding.  Physical Exam:  General: alert, cooperative and appears stated age Lochia: appropriate Uterine Fundus: firm Incision: healing well, no significant drainage, no dehiscence DVT Evaluation: No evidence of DVT seen on physical exam. Negative Homan's sign. No cords or calf tenderness.  Recent Labs    06/25/17 0038 06/26/17 0515  HGB 13.2 12.5  HCT 38.3 37.6    Assessment/Plan: Discharge home   LOS: 2 days   Ashaya Raftery 06/27/2017, 1:01 PM

## 2017-06-27 NOTE — Discharge Summary (Signed)
Obstetric Discharge Summary Reason for Admission: induction of labor Prenatal Procedures: none Intrapartum Procedures: spontaneous vaginal delivery and vacuum Postpartum Procedures: none Complications-Operative and Postpartum: 2nd degree perineal laceration and chorioamnionitis Hemoglobin  Date Value Ref Range Status  06/26/2017 12.5 12.0 - 15.0 g/dL Final   Hemoglobin, fingerstick  Date Value Ref Range Status  05/05/2016 13.1 12.0 - 16.0 g/dL Final   HCT  Date Value Ref Range Status  06/26/2017 37.6 36.0 - 46.0 % Final    Physical Exam:  General: alert, cooperative and appears stated age Lochia: appropriate Uterine Fundus: firm Incision: healing well, no significant drainage, no dehiscence DVT Evaluation: No evidence of DVT seen on physical exam. Negative Homan's sign. No cords or calf tenderness.  Discharge Diagnoses: Term Pregnancy-delivered  Discharge Information: Date: 06/27/2017 Activity: pelvic rest Diet: routine Medications: PNV and Ibuprofen Condition: stable Instructions: refer to practice specific booklet Discharge to: home   Newborn Data: Live born female  Birth Weight: 7 lb 10.8 oz (3481 g) APGAR: 8, 9  Newborn Delivery   Birth date/time:  06/25/2017 18:37:00 Delivery type:  Vaginal, Vacuum (Extractor)     Home with mother.  Lonald Troiani 06/27/2017, 1:05 PM

## 2017-06-27 NOTE — Lactation Note (Addendum)
This note was copied from a baby's chart. Lactation Consultation Note  Patient Name: Ruth Bailey XBMWU'XToday's Date: 06/27/2017 Reason for consult: Follow-up assessment;Difficult latch  NICU baby 6742 hours old. Called to assist baby with latching using NS. However, mom has been attempting to latch and not alert or cueing to nurse. Mom requested assistance with feeding baby "however" baby will eat, because baby sleepy at breast and having trouble with bottle at this feeding. Demonstrated how to supplement baby with curve-tipped syringe and finger, and baby took 32 ml over 20 minutes--and baby tolerated well. Enc mom to post-pump followed by hand expression. Mom reports that she is not seeing any EBM. Enc mom to perform hands-on pumping, and discussed the method.   Mom has a 1-inch x 0.5-inch hard, palpable lump in left breast above mom's areola. Mom reports that this lump appeared about a week before this baby's birth. Enc mom to discuss with her provider, and enc FOB to remind mom to follow-up in case she forgets. Enc mom to attempt to pre-fill NS using curve-tipped syringe at next feeding--depending on baby's alertness. Discussed finger-feeding and then latching to breast as well.   Enc mom to put baby to breast with cues, then supplement with EBM/formula, and then post-pump followed by hand expression. Discussed EBM storage guidelines, and mom aware of OP/BFSG and LC phone line assistance after D/C. Mom reports that she has DEBP, and is comfortable with pumping.   Maternal Data    Feeding Feeding Type: Formula Length of feed: 20 min  LATCH Score                   Interventions    Lactation Tools Discussed/Used     Consult Status Consult Status: PRN Follow-up type: In-patient    Sherlyn HayJennifer D Chanti Golubski 06/27/2017, 1:01 PM

## 2017-08-28 ENCOUNTER — Telehealth: Payer: Self-pay | Admitting: *Deleted

## 2017-08-28 NOTE — Telephone Encounter (Signed)
Patient in 08 recall for 07/2017. Please contact patient regarding scheduling -eh

## 2017-09-05 NOTE — Telephone Encounter (Signed)
Spoke with patient and she states recently had a baby and her OB did a pap/AEX at postpartum visit last week. She will ask them to send copy of pap to our office. She states she and baby are doing well. Routed to Dr.Silva

## 2017-09-16 NOTE — Telephone Encounter (Signed)
Please have front staff use the verbal consent to obtain a copy of her pap.   Cc- Starla Curl

## 2017-09-17 NOTE — Telephone Encounter (Signed)
I called [facility] directly to request the most recent pap report for this patient. The person in the medical records department said they require a signed release from the patient and will not release any records without one.   Because of this, I called the patient to let her know but could not leave a message as her mail box is full. Routing to [place] [employer].

## 2017-09-19 NOTE — Telephone Encounter (Signed)
Called patient but mailbox is full. Need copy of last pap from OB.

## 2017-09-24 NOTE — Telephone Encounter (Signed)
Called patient but mailbox full. Dr.Silva can we close encounter? Routed to Dr.Silva

## 2017-09-26 NOTE — Telephone Encounter (Signed)
Ok to close encounter and place recall for Jan. 2020 - 08.

## 2017-09-27 NOTE — Telephone Encounter (Signed)
08 recall entered. Encounter closed.

## 2019-09-18 ENCOUNTER — Ambulatory Visit: Payer: Self-pay | Attending: Family

## 2019-09-18 DIAGNOSIS — Z23 Encounter for immunization: Secondary | ICD-10-CM | POA: Insufficient documentation

## 2019-09-18 NOTE — Progress Notes (Signed)
   Covid-19 Vaccination Clinic  Name:  SYRA SIRMONS    MRN: [id number] DOB: [birth year]  09/18/2019  Ms. Bertelson was observed post Covid-19 immunization for 15 minutes without incidence. She was provided with Vaccine Information Sheet and instruction to access the V-Safe system.   Ms. Farabaugh was instructed to call 911 with any severe reactions post vaccine: Marland Kitchen Difficulty breathing  . Swelling of your face and throat  . A fast heartbeat  . A bad rash all over your body  . Dizziness and weakness    Immunizations Administered    Name Date Dose VIS Date Route   Moderna COVID-19 Vaccine 09/18/2019 11:52 AM 0.5 mL 06/24/2019 Intramuscular   Manufacturer: Moderna   Lot: [id number]   NDC: 45913-685-99

## 2019-10-21 ENCOUNTER — Ambulatory Visit: Payer: Self-pay

## 2019-10-28 ENCOUNTER — Ambulatory Visit: Payer: Self-pay | Attending: Internal Medicine

## 2019-10-28 DIAGNOSIS — Z23 Encounter for immunization: Secondary | ICD-10-CM

## 2019-10-28 NOTE — Progress Notes (Signed)
   Covid-19 Vaccination Clinic  Name:  Ruth Bailey    MRN: [id number] DOB: 25-May-1986  10/28/2019  [name] was observed post Covid-19 immunization for 15 minutes without incident. She was provided with Vaccine Information Sheet and instruction to access the V-Safe system.   [name] was instructed to call 911 with any severe reactions post vaccine: Marland Kitchen Difficulty breathing  . Swelling of face and throat  . A fast heartbeat  . A bad rash all over body  . Dizziness and weakness   Immunizations Administered    Name Date Dose VIS Date Route   Moderna COVID-19 Vaccine 10/28/2019  9:11 AM 0.5 mL 06/24/2019 Intramuscular   Manufacturer: Moderna   Lot: [id number]   NDC: 67209-470-96
